# Patient Record
Sex: Male | Born: 1955
Health system: Southern US, Community
[De-identification: ages and names within clinical notes are randomized; demographics above are authoritative.]

## PROBLEM LIST (undated history)

## (undated) DIAGNOSIS — I10 Essential (primary) hypertension: Secondary | ICD-10-CM

## (undated) DIAGNOSIS — B159 Hepatitis A without hepatic coma: Secondary | ICD-10-CM

## (undated) DIAGNOSIS — C801 Malignant (primary) neoplasm, unspecified: Secondary | ICD-10-CM

## (undated) DIAGNOSIS — C61 Malignant neoplasm of prostate: Secondary | ICD-10-CM

## (undated) DIAGNOSIS — K219 Gastro-esophageal reflux disease without esophagitis: Secondary | ICD-10-CM

## (undated) HISTORY — DX: Hepatitis a without hepatic coma: B15.9

## (undated) HISTORY — PX: TRANSURETHRAL RESECTION OF PROSTATE: SHX73

## (undated) HISTORY — DX: Essential (primary) hypertension: I10

---

## 2001-02-13 ENCOUNTER — Encounter: Payer: Self-pay | Admitting: Family Medicine

## 2001-02-13 ENCOUNTER — Ambulatory Visit (HOSPITAL_COMMUNITY): Admission: RE | Admit: 2001-02-13 | Discharge: 2001-02-13 | Payer: Self-pay | Admitting: *Deleted

## 2004-03-31 ENCOUNTER — Emergency Department (HOSPITAL_COMMUNITY): Admission: EM | Admit: 2004-03-31 | Discharge: 2004-04-01 | Payer: Self-pay | Admitting: Emergency Medicine

## 2007-07-23 ENCOUNTER — Emergency Department (HOSPITAL_COMMUNITY): Admission: EM | Admit: 2007-07-23 | Discharge: 2007-07-23 | Payer: Self-pay | Admitting: Emergency Medicine

## 2008-06-29 ENCOUNTER — Ambulatory Visit: Admission: RE | Admit: 2008-06-29 | Discharge: 2008-07-10 | Payer: Self-pay | Admitting: Radiation Oncology

## 2008-09-18 ENCOUNTER — Encounter (INDEPENDENT_AMBULATORY_CARE_PROVIDER_SITE_OTHER): Payer: Self-pay | Admitting: Urology

## 2008-09-18 ENCOUNTER — Inpatient Hospital Stay (HOSPITAL_COMMUNITY): Admission: RE | Admit: 2008-09-18 | Discharge: 2008-09-19 | Payer: Self-pay | Admitting: Urology

## 2011-03-04 NOTE — Op Note (Signed)
Russell Jackson, Russell Jackson                  ACCOUNT NO.:  [id number]   MEDICAL RECORD NO.:  [id number]          PATIENT TYPE:  INP   LOCATION:  0008                         FACILITY:  [facility]   PHYSICIAN:  Heloise Purpura, MD      DATE OF BIRTH:  [birth year]   DATE OF PROCEDURE:  09/18/2008  DATE OF DISCHARGE:                               OPERATIVE REPORT   PREOPERATIVE DIAGNOSIS:  Clinically localized adenocarcinoma of prostate  (clinical stage T1C, NX, MX).   POSTOPERATIVE DIAGNOSIS:  Clinically localized adenocarcinoma of  prostate (clinical stage T1C, NX, MX).   PROCEDURE:  1. Robotic assisted laparoscopic radical prostatectomy (bilateral      nerve sparing).  2. Bilateral laparoscopic pelvic lymphadenectomy.   SURGEON:  Heloise Purpura, MD   ASSISTANT:  Delia Chimes, nurse practitioner.   ANESTHESIA:  General.   COMPLICATIONS:  None.   ESTIMATED BLOOD LOSS:  100 mL.   INTRAVENOUS FLUIDS:  1800 mL of lactated Ringer's.   SPECIMENS:  1. Prostate and seminal vesicles.  2. Right pelvic lymph nodes.  3. Left pelvic lymph nodes.   DISPOSITION OF SPECIMEN:  To pathology.   DRAINS:  1. 20-French coude catheter.  2. #19 Blake pelvic drain.   INDICATIONS:  Mr. Sciandra is a 56 year old gentleman with clinically  localized adenocarcinoma of prostate.  After discussion regarding  management options for treatment, he elected to proceed with surgical  therapy and the above procedures.  Potential risks, complications, and  alternative options were discussed in detail with the patient and  informed consent was obtained.   DESCRIPTION OF PROCEDURE:  The patient was taken to the operating room  and a general anesthetic was administered.  He was given preoperative  antibiotics, placed in the dorsal lithotomy position, and prepped and  draped in the usual sterile fashion.  Next a preoperative time-out was  performed.  A Foley catheter was then inserted into the bladder and a  site was selected  just superior to the umbilicus for placement of camera  port.  This was placed using a standard open Hasson technique which  allowed entry into the peritoneal cavity under direct vision and without  difficulty.  A 12 mm port was then placed and a pneumoperitoneum  established.  The 0 degrees lens was used to inspect the abdomen and  there was no evidence for any intra-abdominal injuries or other  abnormalities.  The remaining ports were then placed.  Bilateral 8 mm  robotic ports placed 10 cm lateral and just inferior to the camera port  site.  An additional 8 mm robotic was placed in the far left abdominal  wall.  A 5 mm port was placed between the camera port and the right  robotic port and an additional 12 mm port was placed in the far right  lateral abdominal wall for laparoscopic assistance.  All ports were  placed under direct vision and without difficulty.  The surgical cart  was then docked.  With the aid of the cautery scissors, the bladder was  reflected posteriorly allowing entry into space of Retzius and  identification of the endopelvic fascia and prostate.  The endopelvic  fascia was then incised from the apex back to the base of the prostate  bilaterally and the underlying levator muscle fibers were swept  laterally off the prostate.  This isolated the dorsal venous complex  which was then stapled and divided with the 60 mm articulating Ethicon  stapler.  The bladder neck was then identified with the aid of Foley  catheter manipulation and was divided anteriorly.  This allowed exposure  of catheter.  The catheter balloon was deflated and the catheter was  brought into the operative field and used to retract the prostate  anteriorly.  This exposed the posterior bladder neck.  There was noted  to be a small median lobe which was excised with the prostate.  Dissection then proceeded between the median lobe and bladder until the  vasa deferentia and seminal vesicles were  identified.  The vasa  deferentia were isolated, divided and lifted anteriorly.  The seminal  vessels then dissected down to their tips with care to control seminal  vesicle arterial blood supply.  The space between Denonvilliers fascia  and the anterior rectum was then bluntly developed thereby isolating the  vascular pedicles of the prostate.  The lateral prostatic fascia was  incised sharply allowing the neurovascular bundles to be released  bilaterally and the vascular pedicles of the prostate was then ligated  with Hem-o-lok clips above the left neurovascular bundles.  The  neurovascular bundles were then swept off the apex of prostate and the  urethra and urethra was sharply transected allowing the prostate  specimen to be disarticulated.  The pelvis was then copiously irrigated.  Hemostasis was ensured.  There was no evidence for a rectal injury.  Attention then turned to the right pelvic sidewall.  The fibrofatty  tissue between the external iliac vein, confluence of the iliac vessels,  hypogastric artery, and Cooper's ligament was dissected free from the  pelvic sidewall with care to preserve the obturator nerve.  Hem-o-lok  clips were used for lymphostasis and hemostasis.  Specimen was then  passed off for permanent pathologic analysis and an identical procedure  was performed on the contralateral side.  Attention then turned to the  urethral anastomosis.  A 2-0 Vicryl slip-knot was placed at 6 o'clock  position between the bladder neck, urethra, and Denonvilliers fascia to  reapproximate these structures.  A double-armed 3-0 Monocryl suture was  then used to perform a 360 degree running tension-free anastomosis.  A  new 20-French coude catheter was inserted into the bladder and  irrigated.  There no blood clots within the bladder and the anastomosis  appeared be watertight.  A #19 Blake drain was then brought through the  left robotic port and appropriately positioned in the  pelvis.  It was  secured to the skin with a nylon suture.  The surgical cart was then  undocked.  The right lateral 12 mm port site was closed with a 0 Vicryl  suture placed of the aid of suture passer device.  The remaining ports  were then removed under direct vision and prostate specimen was removed  intact within the Endopouch retrieval bag via the periumbilical port  site.  This fascial opening was then closed with a running 0 Vicryl  suture.  All port sites were injected with 0.25% Marcaine and  reapproximated at skin level with staples.  Sterile dressings were  applied.  The patient appeared to tolerate the procedure well and  without complications.  He was able to be extubated and transferred to  recovery unit in satisfactory condition.      Heloise Purpura, MD  Electronically Signed     LB/MEDQ  D:  09/18/2008  T:  09/18/2008  Job:  [id number]

## 2011-07-22 LAB — BASIC METABOLIC PANEL
BUN: 13 mg/dL (ref 6–23)
CO2: 29 mEq/L (ref 19–32)
Calcium: 9.2 mg/dL (ref 8.4–10.5)
Chloride: 107 mEq/L (ref 96–112)
Creatinine, Ser: 1.19 mg/dL (ref 0.4–1.5)
GFR calc Af Amer: >60 mL/min (ref >60)
GFR calc non Af Amer: >60 mL/min (ref >60)
Glucose, Bld: 97 mg/dL (ref 70–99)
Potassium: 4.2 mEq/L (ref 3.5–5.1)
Sodium: 142 mEq/L (ref 135–145)

## 2011-07-22 LAB — HEMOGLOBIN AND HEMATOCRIT, BLOOD
HCT: 39.1 % (ref 39.0–52.0)
Hemoglobin: 13.1 g/dL (ref 13.0–17.0)

## 2011-07-22 LAB — CBC
HCT: 45.3 % (ref 39.0–52.0)
Hemoglobin: 14.9 g/dL (ref 13.0–17.0)
MCHC: 32.9 g/dL (ref 30.0–36.0)
MCV: 92.8 fL (ref 78.0–100.0)
Platelets: 192 10*3/uL (ref 150–400)
RBC: 4.89 MIL/uL (ref 4.22–5.81)
RDW: 13.3 % (ref 11.5–15.5)
WBC: 5.9 10*3/uL (ref 4.0–10.5)

## 2011-07-22 LAB — TYPE AND SCREEN
ABO/RH(D): A POS
Antibody Screen: NEGATIVE

## 2011-07-25 LAB — HEMOGLOBIN AND HEMATOCRIT, BLOOD: Hemoglobin: 12.6 g/dL — ABNORMAL LOW (ref 13.0–17.0)

## 2011-07-31 LAB — URINE CULTURE

## 2016-01-12 ENCOUNTER — Ambulatory Visit (INDEPENDENT_AMBULATORY_CARE_PROVIDER_SITE_OTHER): Payer: PRIVATE HEALTH INSURANCE | Admitting: Family Medicine

## 2016-01-12 VITALS — BP 122/80 | HR 80 | Temp 98.1°F | Resp 16 | Ht 67.25 in | Wt 177.6 lb

## 2016-01-12 DIAGNOSIS — H8111 Benign paroxysmal vertigo, right ear: Secondary | ICD-10-CM

## 2016-01-12 DIAGNOSIS — J0100 Acute maxillary sinusitis, unspecified: Secondary | ICD-10-CM | POA: Diagnosis not present

## 2016-01-12 MED ORDER — MECLIZINE HCL 12.5 MG PO TABS: 12.5000 mg | ORAL_TABLET | Freq: Three times a day (TID) | ORAL | Status: DC | PRN

## 2016-01-12 MED ORDER — AMOXICILLIN 500 MG PO CAPS: 500.0000 mg | ORAL_CAPSULE | Freq: Three times a day (TID) | ORAL | Status: DC

## 2016-01-12 NOTE — Patient Instructions (Signed)
Benign Positional Vertigo Vertigo is the feeling that you or your surroundings are moving when they are not. Benign positional vertigo is the most common form of vertigo. The cause of this condition is not serious (is benign). This condition is triggered by certain movements and positions (is positional). This condition can be dangerous if it occurs while you are doing something that could endanger you or others, such as driving.  CAUSES In many cases, the cause of this condition is not known. It may be caused by a disturbance in an area of the inner ear that helps your brain to sense movement and balance. This disturbance can be caused by a viral infection (labyrinthitis), head injury, or repetitive motion. RISK FACTORS This condition is more likely to develop in:  Women.  People who are 50 years of age or older. SYMPTOMS Symptoms of this condition usually happen when you move your head or your eyes in different directions. Symptoms may start suddenly, and they usually last for less than a minute. Symptoms may include:  Loss of balance and falling.  Feeling like you are spinning or moving.  Feeling like your surroundings are spinning or moving.  Nausea and vomiting.  Blurred vision.  Dizziness.  Involuntary eye movement (nystagmus). Symptoms can be mild and cause only slight annoyance, or they can be severe and interfere with daily life. Episodes of benign positional vertigo may return (recur) over time, and they may be triggered by certain movements. Symptoms may improve over time. DIAGNOSIS This condition is usually diagnosed by medical history and a physical exam of the head, neck, and ears. You may be referred to a health care provider who specializes in ear, nose, and throat (ENT) problems (otolaryngologist) or a provider who specializes in disorders of the nervous system (neurologist). You may have additional testing, including:  MRI.  A CT scan.  Eye movement tests. Your  health care provider may ask you to change positions quickly while he or she watches you for symptoms of benign positional vertigo, such as nystagmus. Eye movement may be tested with an electronystagmogram (ENG), caloric stimulation, the Dix-Hallpike test, or the roll test.  An electroencephalogram (EEG). This records electrical activity in your brain.  Hearing tests. TREATMENT Usually, your health care provider will treat this by moving your head in specific positions to adjust your inner ear back to normal. Surgery may be needed in severe cases, but this is rare. In some cases, benign positional vertigo may resolve on its own in 2-4 weeks. HOME CARE INSTRUCTIONS Safety  Move slowly.Avoid sudden body or head movements.  Avoid driving.  Avoid operating heavy machinery.  Avoid doing any tasks that would be dangerous to you or others if a vertigo episode would occur.  If you have trouble walking or keeping your balance, try using a cane for stability. If you feel dizzy or unstable, sit down right away.  Return to your normal activities as told by your health care provider. Ask your health care provider what activities are safe for you. General Instructions  Take over-the-counter and prescription medicines only as told by your health care provider.  Avoid certain positions or movements as told by your health care provider.  Drink enough fluid to keep your urine clear or pale yellow.  Keep all follow-up visits as told by your health care provider. This is important. SEEK MEDICAL CARE IF:  You have a fever.  Your condition gets worse or you develop new symptoms.  Your family or friends   notice any behavioral changes.  Your nausea or vomiting gets worse.  You have numbness or a "pins and needles" sensation. SEEK IMMEDIATE MEDICAL CARE IF:  You have difficulty speaking or moving.  You are always dizzy.  You faint.  You develop severe headaches.  You have weakness in your  legs or arms.  You have changes in your hearing or vision.  You develop a stiff neck.  You develop sensitivity to light.   This information is not intended to replace advice given to you by your health care provider. Make sure you discuss any questions you have with your health care provider.   Document Released: 07/14/2006 Document Revised: 06/27/2015 Document Reviewed: 01/29/2015 Elsevier Interactive Patient Education 2016 Else[employer] Sinusitis, Adult Sinusitis is redness, soreness, and inflammation of the paranasal sinuses. Paranasal sinuses are air pockets within the bones of your face. They are located beneath your eyes, in the middle of your forehead, and above your eyes. In healthy paranasal sinuses, mucus is able to drain out, and air is able to circulate through them by way of your nose. However, when your paranasal sinuses are inflamed, mucus and air can become trapped. This can allow bacteria and other germs to grow and cause infection. Sinusitis can develop quickly and last only a short time (acute) or continue over a long period (chronic). Sinusitis that lasts for more than 12 weeks is considered chronic. CAUSES Causes of sinusitis include:  Allergies.  Structural abnormalities, such as displacement of the cartilage that separates your nostrils (deviated septum), which can decrease the air flow through your nose and sinuses and affect sinus drainage.  Functional abnormalities, such as when the small hairs (cilia) that line your sinuses and help remove mucus do not work properly or are not present. SIGNS AND SYMPTOMS Symptoms of acute and chronic sinusitis are the same. The primary symptoms are pain and pressure around the affected sinuses. Other symptoms include:  Upper toothache.  Earache.  Headache.  Bad breath.  Decreased sense of smell and taste.  A cough, which worsens when you are lying flat.  Fatigue.  Fever.  Thick drainage from your nose, which often  is green and may contain pus (purulent).  Swelling and warmth over the affected sinuses. DIAGNOSIS Your health care provider will perform a physical exam. During your exam, your health care provider may perform any of the following to help determine if you have acute sinusitis or chronic sinusitis:  Look in your nose for signs of abnormal growths in your nostrils (nasal polyps).  Tap over the affected sinus to check for signs of infection.  View the inside of your sinuses using an imaging device that has a light attached (endoscope). If your health care provider suspects that you have chronic sinusitis, one or more of the following tests may be recommended:  Allergy tests.  Nasal culture. A sample of mucus is taken from your nose, sent to a lab, and screened for bacteria.  Nasal cytology. A sample of mucus is taken from your nose and examined by your health care provider to determine if your sinusitis is related to an allergy. TREATMENT Most cases of acute sinusitis are related to a viral infection and will resolve on their own within 10 days. Sometimes, medicines are prescribed to help relieve symptoms of both acute and chronic sinusitis. These may include pain medicines, decongestants, nasal steroid sprays, or saline sprays. However, for sinusitis related to a bacterial infection, your health care provider will prescribe antibiotic medicines.  These are medicines that will help kill the bacteria causing the infection. Rarely, sinusitis is caused by a fungal infection. In these cases, your health care provider will prescribe antifungal medicine. For some cases of chronic sinusitis, surgery is needed. Generally, these are cases in which sinusitis recurs more than 3 times per year, despite other treatments. HOME CARE INSTRUCTIONS  Drink plenty of water. Water helps thin the mucus so your sinuses can drain more easily.  Use a humidifier.  Inhale steam 3-4 times a day (for example, sit in  the bathroom with the shower running).  Apply a warm, moist washcloth to your face 3-4 times a day, or as directed by your health care provider.  Use saline nasal sprays to help moisten and clean your sinuses.  Take medicines only as directed by your health care provider.  If you were prescribed either an antibiotic or antifungal medicine, finish it all even if you start to feel better. SEEK IMMEDIATE MEDICAL CARE IF:  You have increasing pain or severe headaches.  You have nausea, vomiting, or drowsiness.  You have swelling around your face.  You have vision problems.  You have a stiff neck.  You have difficulty breathing.   This information is not intended to replace advice given to you by your health care provider. Make sure you discuss any questions you have with your health care provider.   Document Released: 10/06/2005 Document Revised: 10/27/2014 Document Reviewed: 10/21/2011 Elsevier Interactive Patient Education [employer].

## 2016-01-12 NOTE — Progress Notes (Signed)
   Subjective:    Patient ID: Russell Jackson, male    DOB: 07-Aug-1956, 60 y.o.   MRN: [id number] By signing my name below, I, Russell Jackson, attest that this documentation has been prepared under the direction and in the presence of Russell Haber, MD. Electronically Signed: Soijett Jackson, ED Scribe. 01/12/2016. 1:49 PM.  Chief Complaint  Patient presents with  . Dizziness  . Sinus Problem  . Cough    HPI  HPI Comments: Russell Jackson is a 60 y.o. male who presents today complaining of dizziness onset this morning. He reports that his symptoms began with URI-like symptoms. Pt denies his dizziness causing him to throw up. Pt notes that he had to hold onto things to brace himself while walking and htat his dizziness is worsened with turning his head left and right. Pt last episode of dizziness was 15 years ago. He states that he is having associated symptoms of bilateral tinnitus, cough, and sneezing. He states that he has not tried any medications for the relief for his symptoms. He denies hearing loss, ear pain, and any other symptoms.  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   History reviewed. No pertinent past medical history.  No Known Allergies  No current outpatient prescriptions on file prior to visit.   No current facility-administered medications on file prior to visit.     Review of Systems  HENT: Positive for tinnitus. Negative for ear pain.   Respiratory: Positive for cough.   Neurological: Positive for dizziness.  All other systems reviewed and are negative.      Objective:   Physical Exam  Constitutional: He is oriented to person, place, and time. He appears well-developed and well-nourished. No distress.  Had the pt to lie down abrupt quickly and it reproduces vertigo symptoms for approximately 25 seconds.   HENT:  Head: Normocephalic and atraumatic.  Right Ear: Tympanic membrane, external ear and ear canal normal.  Left Ear:  Tympanic membrane, external ear and ear canal normal.  Narrow nasal passages with nuchal purulent discharge.  Eyes: EOM are normal. Pupils are equal, round, and reactive to light. Right eye exhibits no nystagmus. Left eye exhibits no nystagmus.  Neck: Neck supple.  Cardiovascular: Normal rate.   Pulmonary/Chest: Effort normal. No respiratory distress.  Musculoskeletal: Normal range of motion.  Neurological: He is alert and oriented to person, place, and time.  Skin: Skin is warm and dry.  Psychiatric: He has a normal mood and affect. His behavior is normal.  Nursing note and vitals reviewed.        BP 122/80 mmHg  Pulse 80  Temp(Src) 98.1 F (36.7 C) (Oral)  Resp 16  Ht 5' 7.25" (1.708 m)  Wt 177 lb 9.6 oz (80.559 kg)  BMI 27.61 kg/m2  SpO2 98%  Assessment & Plan:   Benign paroxysmal positional vertigo, right - Plan: meclizine (ANTIVERT) 12.5 MG tablet, amoxicillin (AMOXIL) 500 MG capsule  Acute maxillary sinusitis, recurrence not specified - Plan: meclizine (ANTIVERT) 12.5 MG tablet, amoxicillin (AMOXIL) 500 MG capsule  This chart was scribed in my presence and reviewed by me personally. Russell Shadow Russell Eshleman,MD

## 2016-06-26 ENCOUNTER — Other Ambulatory Visit: Payer: Self-pay | Admitting: Gastroenterology

## 2016-07-17 ENCOUNTER — Encounter (HOSPITAL_COMMUNITY): Payer: Self-pay | Admitting: *Deleted

## 2016-07-21 NOTE — Anesthesia Preprocedure Evaluation (Addendum)
Anesthesia Evaluation  Patient identified by MRN, date of birth, ID band Patient awake    Reviewed: Allergy & Precautions, NPO status , Patient's Chart, lab work & pertinent test results  History of Anesthesia Complications Negative for: history of anesthetic complications  Airway Mallampati: I  TM Distance: >3 FB Neck ROM: Full    Dental  (+) Dental Advisory Given, Upper Dentures   Pulmonary neg shortness of breath, neg sleep apnea, neg COPD, neg recent URI, former smoker,    Pulmonary exam normal breath sounds clear to auscultation       Cardiovascular negative cardio ROS   Rhythm:Regular Rate:Normal     Neuro/Psych neg Seizures Right BPPV    GI/Hepatic Neg liver ROS, Bowel prep,GERD  Controlled,  Endo/Other  negative endocrine ROS  Renal/GU negative Renal ROS     Musculoskeletal   Abdominal   Peds  Hematology negative hematology ROS (+)   Anesthesia Other Findings H/o prostate cancer  Reproductive/Obstetrics                           Anesthesia Physical Anesthesia Plan  ASA: II  Anesthesia Plan: MAC   Post-op Pain Management:    Induction: Intravenous  Airway Management Planned: Natural Airway and Nasal Cannula  Additional Equipment:   Intra-op Plan:   Post-operative Plan:   Informed Consent: I have reviewed the patients History and Physical, chart, labs and discussed the procedure including the risks, benefits and alternatives for the proposed anesthesia with the patient or authorized representative who has indicated his/her understanding and acceptance.   Dental advisory given  Plan Discussed with: CRNA  Anesthesia Plan Comments:        Anesthesia Quick Evaluation

## 2016-07-22 ENCOUNTER — Encounter (HOSPITAL_COMMUNITY): Payer: Self-pay | Admitting: *Deleted

## 2016-07-22 ENCOUNTER — Encounter (HOSPITAL_COMMUNITY)
Admission: RE | Disposition: A | Discharge status: Home / Self Care | Payer: Self-pay | Source: Ambulatory Visit | Attending: Gastroenterology

## 2016-07-22 ENCOUNTER — Ambulatory Visit (HOSPITAL_COMMUNITY): Payer: PRIVATE HEALTH INSURANCE | Admitting: Anesthesiology

## 2016-07-22 ENCOUNTER — Ambulatory Visit (HOSPITAL_COMMUNITY)
Admission: RE | Admit: 2016-07-22 | Discharge: 2016-07-22 | Disposition: A | Discharge status: Home / Self Care | Payer: PRIVATE HEALTH INSURANCE | Source: Ambulatory Visit | Attending: Gastroenterology | Admitting: Gastroenterology

## 2016-07-22 DIAGNOSIS — Z87442 Personal history of urinary calculi: Secondary | ICD-10-CM | POA: Diagnosis not present

## 2016-07-22 DIAGNOSIS — K219 Gastro-esophageal reflux disease without esophagitis: Secondary | ICD-10-CM | POA: Insufficient documentation

## 2016-07-22 DIAGNOSIS — D125 Benign neoplasm of sigmoid colon: Secondary | ICD-10-CM | POA: Insufficient documentation

## 2016-07-22 DIAGNOSIS — Z86010 Personal history of colonic polyps: Secondary | ICD-10-CM | POA: Diagnosis not present

## 2016-07-22 DIAGNOSIS — Z8546 Personal history of malignant neoplasm of prostate: Secondary | ICD-10-CM | POA: Diagnosis not present

## 2016-07-22 DIAGNOSIS — Z1211 Encounter for screening for malignant neoplasm of colon: Secondary | ICD-10-CM | POA: Diagnosis present

## 2016-07-22 DIAGNOSIS — Z87891 Personal history of nicotine dependence: Secondary | ICD-10-CM | POA: Insufficient documentation

## 2016-07-22 HISTORY — DX: Gastro-esophageal reflux disease without esophagitis: K21.9

## 2016-07-22 HISTORY — DX: Malignant (primary) neoplasm, unspecified: C80.1

## 2016-07-22 HISTORY — PX: COLONOSCOPY WITH PROPOFOL: SHX5780

## 2016-07-22 SURGERY — COLONOSCOPY WITH PROPOFOL
Anesthesia: Monitor Anesthesia Care

## 2016-07-22 MED ORDER — LACTATED RINGERS IV SOLN
INTRAVENOUS | Status: DC | PRN
  Administered 2016-07-22: 07:00:00 via INTRAVENOUS

## 2016-07-22 MED ORDER — PROPOFOL 10 MG/ML IV BOLUS
INTRAVENOUS | Status: AC
  Filled 2016-07-22: qty 40

## 2016-07-22 MED ORDER — PROMETHAZINE HCL 25 MG/ML IJ SOLN: 6.2500 mg | INTRAMUSCULAR | Status: DC | PRN

## 2016-07-22 MED ORDER — SODIUM CHLORIDE 0.9 % IV SOLN: INTRAVENOUS | Status: DC

## 2016-07-22 MED ORDER — PROPOFOL 10 MG/ML IV BOLUS
INTRAVENOUS | Status: AC
  Filled 2016-07-22: qty 20

## 2016-07-22 MED ORDER — PROPOFOL 500 MG/50ML IV EMUL
INTRAVENOUS | Status: DC | PRN
  Administered 2016-07-22: 200 ug/kg/min via INTRAVENOUS

## 2016-07-22 MED ORDER — FENTANYL CITRATE (PF) 100 MCG/2ML IJ SOLN: 25.0000 ug | INTRAMUSCULAR | Status: DC | PRN

## 2016-07-22 MED ORDER — LACTATED RINGERS IV SOLN
INTRAVENOUS | Status: DC
  Administered 2016-07-22: 07:00:00 via INTRAVENOUS

## 2016-07-22 SURGICAL SUPPLY — 22 items

## 2016-07-22 NOTE — Op Note (Signed)
[facility] Patient Name: Russell Jackson Procedure Date: 07/22/2016 MRN: [id number] Attending MD: Garlan Fair , MD Date of Birth: [birth year] CSN: [id number] Age: 60 Admit Type: Outpatient Procedure:                Colonoscopy Indications:              High risk colon cancer surveillance: Personal                            history of non-advanced adenoma Providers:                Garlan Fair, MD, Hilma Favors, RN, William Dalton, Technician Referring MD:              Medicines:                Propofol per Anesthesia Complications:            No immediate complications. Estimated Blood Loss:     Estimated blood loss: none. Procedure:                Pre-Anesthesia Assessment:                           - Prior to the procedure, a History and Physical                            was performed, and patient medications and                            allergies were reviewed. The patient's tolerance of                            previous anesthesia was also reviewed. The risks                            and benefits of the procedure and the sedation                            options and risks were discussed with the patient.                            All questions were answered, and informed consent                            was obtained. Prior Anticoagulants: The patient has                            taken no previous anticoagulant or antiplatelet                            agents. ASA Grade Assessment: II - A patient with  mild systemic disease. After reviewing the risks                            and benefits, the patient was deemed in                            satisfactory condition to undergo the procedure.                           After obtaining informed consent, the colonoscope                            was passed under direct vision. Throughout the                            procedure, the patient's  blood pressure, pulse, and                            oxygen saturations were monitored continuously. The                            [id number] scope was introduced through                            the anus and advanced to the the cecum, identified                            by appendiceal orifice and ileocecal valve. The                            colonoscopy was somewhat difficult due to                            significant looping. The patient tolerated the                            procedure well. The quality of the bowel                            preparation was good. The appendiceal orifice and                            the rectum were photographed. Scope In: 7:31:50 AM Scope Out: 8:01:25 AM Total Procedure Duration: 0 hours 29 minutes 35 seconds  Findings:      The perianal and digital rectal examinations were normal.      A 3 mm polyp was found in the mid sigmoid colon. The polyp was sessile.       The polyp was removed with a cold biopsy forceps. Resection and       retrieval were complete.      The exam was otherwise without abnormality. Impression:               - One 3 mm polyp in the mid sigmoid colon, removed  with a cold biopsy forceps. Resected and retrieved.                           - The examination was otherwise normal. Moderate Sedation:      N/A- Per Anesthesia Care Recommendation:           - Patient has a contact number available for                            emergencies. The signs and symptoms of potential                            delayed complications were discussed with the                            patient. Return to normal activities tomorrow.                            Written discharge instructions were provided to the                            patient.                           - Repeat colonoscopy in 5 years for surveillance.                           - Resume previous diet.                           -  Continue present medications. Procedure Code(s):        --- Professional ---                           [phone], Colonoscopy, flexible; with biopsy, single                            or multiple Diagnosis Code(s):        --- Professional ---                           Z86.010, Personal history of colonic polyps                           D12.5, Benign neoplasm of sigmoid colon CPT copyright 2016 American Medical Association. All rights reserved. The codes documented in this report are preliminary and upon coder review may  be revised to meet current compliance requirements. Earle Gell, MD Garlan Fair, MD 07/22/2016 8:08:44 AM This report has been signed electronically. Number of Addenda: 0

## 2016-07-22 NOTE — Anesthesia Postprocedure Evaluation (Signed)
Anesthesia Post Note  Patient: Russell Jackson  Procedure(s) Performed: Procedure(s) (LRB): COLONOSCOPY WITH PROPOFOL (N/A)  Patient location during evaluation: PACU Anesthesia Type: MAC Level of consciousness: awake and alert Pain management: pain level controlled Vital Signs Assessment: post-procedure vital signs reviewed and stable Respiratory status: spontaneous breathing, nonlabored ventilation and respiratory function stable Cardiovascular status: stable and blood pressure returned to baseline Anesthetic complications: no    Last Vitals:  Vitals:   07/22/16 0650 07/22/16 0810  BP: (!) 134/98 113/71  Pulse: 93 100  Resp: 12 20  Temp: 36.4 C 36.5 C    Last Pain:  Vitals:   07/22/16 0810  TempSrc: Oral                 Nilda Simmer

## 2016-07-22 NOTE — H&P (Signed)
Procedure: Surveillance colonoscopy. August 2007 colonoscopy was performed with removal of a diminutive ascending colon tubular adenomatous polyp. August 2012 normal surveillance colonoscopy was performed.  History: The patient is a 60 year old male born 13-Jun-1956. He is scheduled to undergo a surveillance colonoscopy today.  Past medical history: Prostatectomy for prostate cancer. Kidney stone.  Medication allergies: Naprosyn  Exam: The patient is alert and lying comfortably on the endoscopy stretcher. Abdomen is soft and nontender to palpation. Lungs are clear to auscultation. Cardiac exam reveals a regular rhythm.  Plan: Proceed with surveillance colonoscopy

## 2016-07-22 NOTE — Discharge Instructions (Signed)

## 2016-07-22 NOTE — Transfer of Care (Signed)
Immediate Anesthesia Transfer of Care Note  Patient: Russell Jackson  Procedure(s) Performed: Procedure(s): COLONOSCOPY WITH PROPOFOL (N/A)  Patient Location: PACU  Anesthesia Type:MAC  Level of Consciousness: sedated, patient cooperative and responds to stimulation  Airway & Oxygen Therapy: Patient Spontanous Breathing and Patient connected to face mask oxygen  Post-op Assessment: Report given to RN, Post -op Vital signs reviewed and stable and Patient moving all extremities X 4  Post vital signs: stable  Last Vitals:  Vitals:   07/22/16 0650  BP: (!) 134/98  Pulse: 93  Resp: 12  Temp: 36.4 C    Last Pain:  Vitals:   07/22/16 0650  TempSrc: Oral         Complications: No apparent anesthesia complications

## 2016-07-23 ENCOUNTER — Encounter (HOSPITAL_COMMUNITY): Payer: Self-pay | Admitting: Gastroenterology

## 2017-04-21 DIAGNOSIS — C61 Malignant neoplasm of prostate: Secondary | ICD-10-CM | POA: Diagnosis not present

## 2017-04-28 DIAGNOSIS — N5201 Erectile dysfunction due to arterial insufficiency: Secondary | ICD-10-CM | POA: Diagnosis not present

## 2017-04-28 DIAGNOSIS — C61 Malignant neoplasm of prostate: Secondary | ICD-10-CM | POA: Diagnosis not present

## 2017-04-28 DIAGNOSIS — N393 Stress incontinence (female) (male): Secondary | ICD-10-CM | POA: Diagnosis not present

## 2017-05-29 DIAGNOSIS — Z8546 Personal history of malignant neoplasm of prostate: Secondary | ICD-10-CM | POA: Diagnosis not present

## 2017-05-29 DIAGNOSIS — Z0000 Encounter for general adult medical examination without abnormal findings: Secondary | ICD-10-CM | POA: Diagnosis not present

## 2017-05-29 DIAGNOSIS — K219 Gastro-esophageal reflux disease without esophagitis: Secondary | ICD-10-CM | POA: Diagnosis not present

## 2017-05-29 DIAGNOSIS — E7800 Pure hypercholesterolemia, unspecified: Secondary | ICD-10-CM | POA: Diagnosis not present

## 2017-05-29 DIAGNOSIS — M519 Unspecified thoracic, thoracolumbar and lumbosacral intervertebral disc disorder: Secondary | ICD-10-CM | POA: Diagnosis not present

## 2017-05-29 DIAGNOSIS — N200 Calculus of kidney: Secondary | ICD-10-CM | POA: Diagnosis not present

## 2017-05-29 DIAGNOSIS — F411 Generalized anxiety disorder: Secondary | ICD-10-CM | POA: Diagnosis not present

## 2017-05-29 DIAGNOSIS — Z1389 Encounter for screening for other disorder: Secondary | ICD-10-CM | POA: Diagnosis not present

## 2017-06-25 DIAGNOSIS — Z0100 Encounter for examination of eyes and vision without abnormal findings: Secondary | ICD-10-CM | POA: Diagnosis not present

## 2018-01-04 DIAGNOSIS — M5416 Radiculopathy, lumbar region: Secondary | ICD-10-CM | POA: Diagnosis not present

## 2018-01-18 DIAGNOSIS — M5416 Radiculopathy, lumbar region: Secondary | ICD-10-CM | POA: Diagnosis not present

## 2018-04-04 ENCOUNTER — Emergency Department (HOSPITAL_COMMUNITY): Payer: 59

## 2018-04-04 ENCOUNTER — Emergency Department (HOSPITAL_COMMUNITY)
Admission: EM | Admit: 2018-04-04 | Discharge: 2018-04-04 | Disposition: A | Discharge status: Home / Self Care | Payer: 59 | Attending: Emergency Medicine | Admitting: Emergency Medicine

## 2018-04-04 ENCOUNTER — Encounter (HOSPITAL_COMMUNITY): Payer: Self-pay | Admitting: Emergency Medicine

## 2018-04-04 DIAGNOSIS — Y999 Unspecified external cause status: Secondary | ICD-10-CM | POA: Diagnosis not present

## 2018-04-04 DIAGNOSIS — S8002XA Contusion of left knee, initial encounter: Secondary | ICD-10-CM | POA: Insufficient documentation

## 2018-04-04 DIAGNOSIS — S301XXA Contusion of abdominal wall, initial encounter: Secondary | ICD-10-CM | POA: Insufficient documentation

## 2018-04-04 DIAGNOSIS — S299XXA Unspecified injury of thorax, initial encounter: Secondary | ICD-10-CM | POA: Diagnosis not present

## 2018-04-04 DIAGNOSIS — R51 Headache: Secondary | ICD-10-CM | POA: Diagnosis not present

## 2018-04-04 DIAGNOSIS — S9002XA Contusion of left ankle, initial encounter: Secondary | ICD-10-CM | POA: Insufficient documentation

## 2018-04-04 DIAGNOSIS — S81012A Laceration without foreign body, left knee, initial encounter: Secondary | ICD-10-CM | POA: Diagnosis not present

## 2018-04-04 DIAGNOSIS — S99912A Unspecified injury of left ankle, initial encounter: Secondary | ICD-10-CM | POA: Diagnosis present

## 2018-04-04 DIAGNOSIS — Z8546 Personal history of malignant neoplasm of prostate: Secondary | ICD-10-CM | POA: Diagnosis not present

## 2018-04-04 DIAGNOSIS — Y939 Activity, unspecified: Secondary | ICD-10-CM | POA: Diagnosis not present

## 2018-04-04 DIAGNOSIS — M542 Cervicalgia: Secondary | ICD-10-CM | POA: Insufficient documentation

## 2018-04-04 DIAGNOSIS — Y92410 Unspecified street and highway as the place of occurrence of the external cause: Secondary | ICD-10-CM | POA: Insufficient documentation

## 2018-04-04 DIAGNOSIS — Z87891 Personal history of nicotine dependence: Secondary | ICD-10-CM | POA: Insufficient documentation

## 2018-04-04 DIAGNOSIS — M545 Low back pain: Secondary | ICD-10-CM | POA: Insufficient documentation

## 2018-04-04 DIAGNOSIS — S21119A Laceration without foreign body of unspecified front wall of thorax without penetration into thoracic cavity, initial encounter: Secondary | ICD-10-CM | POA: Diagnosis not present

## 2018-04-04 DIAGNOSIS — S0990XA Unspecified injury of head, initial encounter: Secondary | ICD-10-CM | POA: Diagnosis not present

## 2018-04-04 DIAGNOSIS — S3991XA Unspecified injury of abdomen, initial encounter: Secondary | ICD-10-CM | POA: Diagnosis not present

## 2018-04-04 DIAGNOSIS — S199XXA Unspecified injury of neck, initial encounter: Secondary | ICD-10-CM | POA: Diagnosis not present

## 2018-04-04 DIAGNOSIS — S91012A Laceration without foreign body, left ankle, initial encounter: Secondary | ICD-10-CM | POA: Diagnosis not present

## 2018-04-04 DIAGNOSIS — R109 Unspecified abdominal pain: Secondary | ICD-10-CM | POA: Diagnosis not present

## 2018-04-04 LAB — TYPE AND SCREEN
ABO/RH(D): A POS
Antibody Screen: NEGATIVE

## 2018-04-04 LAB — COMPREHENSIVE METABOLIC PANEL
ALK PHOS: 64 U/L (ref 38–126)
ALT: 16 U/L — AB (ref 17–63)
AST: 24 U/L (ref 15–41)
Albumin: 3.4 g/dL — ABNORMAL LOW (ref 3.5–5.0)
Anion gap: 8 (ref 5–15)
BUN: 15 mg/dL (ref 6–20)
CALCIUM: 9.4 mg/dL (ref 8.9–10.3)
CHLORIDE: 107 mmol/L (ref 101–111)
CO2: 26 mmol/L (ref 22–32)
CREATININE: 1.32 mg/dL — AB (ref 0.61–1.24)
GFR calc Af Amer: >60 mL/min (ref >60)
GFR calc non Af Amer: 56 mL/min — ABNORMAL LOW (ref >60)
Glucose, Bld: 91 mg/dL (ref 65–99)
Potassium: 3.9 mmol/L (ref 3.5–5.1)
SODIUM: 141 mmol/L (ref 135–145)
Total Bilirubin: 0.8 mg/dL (ref 0.3–1.2)
Total Protein: 6.7 g/dL (ref 6.5–8.1)

## 2018-04-04 LAB — CBC WITH DIFFERENTIAL/PLATELET
Abs Immature Granulocytes: 0 10*3/uL (ref 0.0–0.1)
BASOS PCT: 0 %
Basophils Absolute: 0 10*3/uL (ref 0.0–0.1)
EOS ABS: 0.4 10*3/uL (ref 0.0–0.7)
EOS PCT: 6 %
HEMATOCRIT: 50.4 % (ref 39.0–52.0)
Hemoglobin: 16.1 g/dL (ref 13.0–17.0)
Immature Granulocytes: 0 %
LYMPHS ABS: 1.5 10*3/uL (ref 0.7–4.0)
Lymphocytes Relative: 22 %
MCH: 30.4 pg (ref 26.0–34.0)
MCHC: 31.9 g/dL (ref 30.0–36.0)
MCV: 95.1 fL (ref 78.0–100.0)
MONO ABS: 0.5 10*3/uL (ref 0.1–1.0)
MONOS PCT: 7 %
Neutro Abs: 4.5 10*3/uL (ref 1.7–7.7)
Neutrophils Relative %: 65 %
Platelets: 182 10*3/uL (ref 150–400)
RBC: 5.3 MIL/uL (ref 4.22–5.81)
RDW: 12.8 % (ref 11.5–15.5)
WBC: 6.9 10*3/uL (ref 4.0–10.5)

## 2018-04-04 LAB — LIPASE, BLOOD: Lipase: 38 U/L (ref 11–51)

## 2018-04-04 MED ORDER — ONDANSETRON HCL 4 MG/2ML IJ SOLN
4.0000 mg | Freq: Once | INTRAMUSCULAR | Status: AC
  Administered 2018-04-04: 4 mg via INTRAVENOUS
  Filled 2018-04-04: qty 2

## 2018-04-04 MED ORDER — IOHEXOL 300 MG/ML  SOLN
100.0000 mL | Freq: Once | INTRAMUSCULAR | Status: AC | PRN
  Administered 2018-04-04: 100 mL via INTRAVENOUS

## 2018-04-04 MED ORDER — MORPHINE SULFATE (PF) 4 MG/ML IV SOLN
4.0000 mg | Freq: Once | INTRAVENOUS | Status: AC
  Administered 2018-04-04: 4 mg via INTRAVENOUS
  Filled 2018-04-04: qty 1

## 2018-04-04 MED ORDER — KETOROLAC TROMETHAMINE 30 MG/ML IJ SOLN
30.0000 mg | Freq: Once | INTRAMUSCULAR | Status: AC
  Administered 2018-04-04: 30 mg via INTRAVENOUS
  Filled 2018-04-04: qty 1
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
## 2018-04-04 NOTE — ED Notes (Signed)
Patient verbalizes understanding of discharge instructions. Opportunity for questioning and answers were provided. Armband removed by staff, pt discharged from ED ambulatory with crutches and family.  
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
## 2018-04-04 NOTE — ED Notes (Signed)
Ortho paged for ankle brace

## 2018-04-04 NOTE — ED Notes (Signed)
Assisted with removing patient from backboard with Dr. Elita Quick, EMT, and [place], [place]

## 2018-04-04 NOTE — ED Notes (Signed)
ED Provider at bedside. 

## 2018-04-04 NOTE — Progress Notes (Signed)
Orthopedic Tech Progress Note Patient Details:  Russell Jackson [birth year] [id number]  Ortho Devices Type of Ortho Device: ASO, Crutches Ortho Device/Splint Interventions: Application   Post Interventions Patient Tolerated: Well Instructions Provided: Care of device   [name] 04/04/2018, 7:24 PM

## 2018-04-05 LAB — I-STAT CHEM 8, ED
BUN: 25 mg/dL — AB (ref 6–20)
CHLORIDE: 106 mmol/L (ref 101–111)
CREATININE: 1.1 mg/dL (ref 0.61–1.24)
Calcium, Ion: 1.11 mmol/L — ABNORMAL LOW (ref 1.15–1.40)
Glucose, Bld: 91 mg/dL (ref 65–99)
HCT: 47 % (ref 39.0–52.0)
Hemoglobin: 16 g/dL (ref 13.0–17.0)
POTASSIUM: 6.3 mmol/L — AB (ref 3.5–5.1)
Sodium: 139 mmol/L (ref 135–145)
TCO2: 27 mmol/L (ref 22–32)

## 2018-04-05 LAB — ABO/RH: ABO/RH(D): A POS

## 2018-04-21 DIAGNOSIS — C61 Malignant neoplasm of prostate: Secondary | ICD-10-CM | POA: Diagnosis not present

## 2018-04-28 DIAGNOSIS — N5201 Erectile dysfunction due to arterial insufficiency: Secondary | ICD-10-CM | POA: Diagnosis not present

## 2018-04-28 DIAGNOSIS — C61 Malignant neoplasm of prostate: Secondary | ICD-10-CM | POA: Diagnosis not present

## 2018-06-02 DIAGNOSIS — Z1389 Encounter for screening for other disorder: Secondary | ICD-10-CM | POA: Diagnosis not present

## 2018-06-02 DIAGNOSIS — Z0000 Encounter for general adult medical examination without abnormal findings: Secondary | ICD-10-CM | POA: Diagnosis not present

## 2018-06-02 DIAGNOSIS — M519 Unspecified thoracic, thoracolumbar and lumbosacral intervertebral disc disorder: Secondary | ICD-10-CM | POA: Diagnosis not present

## 2018-06-02 DIAGNOSIS — E7800 Pure hypercholesterolemia, unspecified: Secondary | ICD-10-CM | POA: Diagnosis not present

## 2018-06-02 DIAGNOSIS — K219 Gastro-esophageal reflux disease without esophagitis: Secondary | ICD-10-CM | POA: Diagnosis not present

## 2018-06-02 DIAGNOSIS — C61 Malignant neoplasm of prostate: Secondary | ICD-10-CM | POA: Diagnosis not present

## 2018-06-30 DIAGNOSIS — M720 Palmar fascial fibromatosis [Dupuytren]: Secondary | ICD-10-CM | POA: Insufficient documentation
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
## 2018-07-09 DIAGNOSIS — Z0100 Encounter for examination of eyes and vision without abnormal findings: Secondary | ICD-10-CM | POA: Diagnosis not present

## 2018-11-03 DIAGNOSIS — C61 Malignant neoplasm of prostate: Secondary | ICD-10-CM | POA: Diagnosis not present

## 2018-11-10 DIAGNOSIS — N5201 Erectile dysfunction due to arterial insufficiency: Secondary | ICD-10-CM | POA: Diagnosis not present

## 2018-11-10 DIAGNOSIS — C61 Malignant neoplasm of prostate: Secondary | ICD-10-CM | POA: Diagnosis not present

## 2018-11-17 DIAGNOSIS — I10 Essential (primary) hypertension: Secondary | ICD-10-CM | POA: Diagnosis not present

## 2018-11-17 DIAGNOSIS — R079 Chest pain, unspecified: Secondary | ICD-10-CM | POA: Diagnosis not present

## 2018-11-17 MED FILL — ASPIRIN LOW DOSE 81 MG TBEC: 81 | 30 days supply | Qty: 30 | Fill #0

## 2018-11-17 MED FILL — NITROGLYCERIN 0.4 MG TAB SL: 0.4 | 8 days supply | Qty: 25 | Fill #0

## 2018-11-17 MED FILL — LISINOPRIL 10 MG TABLET: 10 | 30 days supply | Qty: 30 | Fill #0

## 2018-11-24 ENCOUNTER — Ambulatory Visit (INDEPENDENT_AMBULATORY_CARE_PROVIDER_SITE_OTHER): Payer: 59 | Admitting: Cardiology

## 2018-11-24 ENCOUNTER — Encounter: Payer: Self-pay | Admitting: Cardiology

## 2018-11-24 VITALS — BP 106/80 | HR 97 | Ht 67.0 in | Wt 175.2 lb

## 2018-11-24 DIAGNOSIS — E782 Mixed hyperlipidemia: Secondary | ICD-10-CM | POA: Diagnosis not present

## 2018-11-24 DIAGNOSIS — I10 Essential (primary) hypertension: Secondary | ICD-10-CM

## 2018-11-24 DIAGNOSIS — R0789 Other chest pain: Secondary | ICD-10-CM | POA: Insufficient documentation

## 2018-11-24 NOTE — Progress Notes (Signed)
Patient referred by Wenda Low , MD for chest/arm pain  Subjective:   '@Patient'  ID: Russell Jackson, male    DOB: [birth year], 63 y.o.   MRN: [id number]  Chief Complaint  Patient presents with  . New Patient (Initial Visit)  . Chest Pain  . Hypertension    HPI  63 year old African-American male with hypertension.  Self-employed, works in Conservation officer, nature. Lives in [place] with his wife. His job and church keep him busy, but does not do any regular exercise.   For past two weeks, has had episodes of retrosternal chest pain and tightness. It is most prominent while lying in bed. Pain does not get worse with exertion, but does get better with sitting and relaxing.   Patient is aware of his elevated cholesterol, but is making diet and lifestyle changes.  Reviewed outside records.  Labs 11/17/2018: Glucose 87, BUN/creatinine 18/1.1.  EGFR 81.  Sodium 140, potassium 4.3.  Rest of the CMP normal H/H 15/46.  MCV 92.  Platelets 184 Cholesterol 215, triglycerides 93, HDL 55, LDL 142  Past Medical History:  Diagnosis Date  . Cancer [facility])    prostate  . GERD (gastroesophageal reflux disease)    tx OTC meds as needed    Past Surgical History:  Procedure Laterality Date  . COLONOSCOPY WITH PROPOFOL N/A 07/22/2016   Procedure: COLONOSCOPY WITH PROPOFOL;  Surgeon: Garlan Fair, MD;  Location: [facility];  Service: Endoscopy;  Laterality: N/A;  . TRANSURETHRAL RESECTION OF PROSTATE      Social History   Socioeconomic History  . Marital status: Married    Spouse name: Not on file  . Number of children: Not on file  . Years of education: Not on file  . Highest education level: Not on file  Occupational History  . Not on file  Social Needs  . Financial resource strain: Not on file  . Food insecurity:    Worry: Not on file    Inability: Not on file  . Transportation needs:    Medical: Not on file    Non-medical: Not on file  Tobacco Use  . Smoking status:  Former Research scientist (life sciences)  . Smokeless tobacco: Never Used  . Tobacco comment: Quit age 57  Substance and Sexual Activity  . Alcohol use: No    Alcohol/week: 0.0 standard drinks  . Drug use: No  . Sexual activity: Not on file  Lifestyle  . Physical activity:    Days per week: Not on file    Minutes per session: Not on file  . Stress: Not on file  Relationships  . Social connections:    Talks on phone: Not on file    Gets together: Not on file    Attends religious service: Not on file    Active member of club or organization: Not on file    Attends meetings of clubs or organizations: Not on file    Relationship status: Not on file  . Intimate partner violence:    Fear of current or ex partner: Not on file    Emotionally abused: Not on file    Physically abused: Not on file    Forced sexual activity: Not on file  Other Topics Concern  . Not on file  Social History Narrative  . Not on file    Current Outpatient Medications on File Prior to Visit  Medication Sig Dispense Refill  . ASPIRIN LOW DOSE 81 MG EC tablet Take 1 tablet by mouth daily.    Marland Kitchen  lisinopril (PRINIVIL,ZESTRIL) 10 MG tablet Take 1 tablet by mouth daily.    . nitroGLYCERIN (NITROSTAT) 0.4 MG SL tablet Place 1 tablet under the tongue as needed.     No current facility-administered medications on file prior to visit.      Review of Systems  Constitution: Negative for decreased appetite, malaise/fatigue, weight gain and weight loss.  HENT:       Postnasal discharge  Eyes: Negative for visual disturbance.  Cardiovascular: Positive for chest pain. Negative for claudication, dyspnea on exertion, leg swelling and syncope.  Respiratory: Positive for cough (Dry ). Negative for shortness of breath.   Endocrine: Negative for cold intolerance.  Hematologic/Lymphatic: Does not bruise/bleed easily.  Skin: Negative for itching and rash.  Musculoskeletal: Negative for myalgias.       Left arm pain  Gastrointestinal: Negative for  abdominal pain, nausea and vomiting.  Genitourinary: Negative for dysuria.  Neurological: Negative for dizziness and weakness.  Psychiatric/Behavioral: The patient is not nervous/anxious.   All other systems reviewed and are negative.       There were no vitals filed for this visit.  Objective:   Physical Exam  Constitutional: He is oriented to person, place, and time. He appears well-developed and well-nourished. No distress.  HENT:  Head: Normocephalic and atraumatic.  Eyes: Pupils are equal, round, and reactive to light. Conjunctivae are normal.  Neck: No JVD present.  Cardiovascular: Normal rate, regular rhythm and intact distal pulses.  No murmur heard. Pulmonary/Chest: Effort normal and breath sounds normal. He has no wheezes. He has no rales.  Abdominal: Soft. Bowel sounds are normal. There is no rebound.  Musculoskeletal:        General: No edema.  Lymphadenopathy:    He has no cervical adenopathy.  Neurological: He is alert and oriented to person, place, and time. No cranial nerve deficit.  Skin: Skin is warm and dry.  Psychiatric: He has a normal mood and affect.  Nursing note and vitals reviewed.         Assessment & Recommendations:   Hypertension, unspecified type -  Plan: EKG 12-Lead EKG 11/24/2018: Sinus  Rhythm  -RSR(V1) -nondiagnostic.   BP controlled on lisinopril 10 mg daily. Continue the same.  Atypical chest pain -  While the pain is atypical, he does have risk for CAD- family h/o CAD, hypertension, hyperlipidemia Plan: PCV CARDIAC STRESS TEST  Mixed hyperlipidemia: 10 yr ASCVD risk 9%. Patient would like to make diet and lifestyle changes. Repeat lipid panel in 6 months with PCP.   I will see him on as needed basis, unless stress test is abnormal.   Thank you for referring the patient to [place]. Please feel free to contact with any questions.  Nigel Mormon, MD [facility] Pager: [phone] Office: [phone] If  no answer Cell [phone]

## 2018-11-24 NOTE — Patient Instructions (Signed)

## 2018-12-08 ENCOUNTER — Ambulatory Visit: Payer: 59

## 2018-12-08 DIAGNOSIS — R0789 Other chest pain: Secondary | ICD-10-CM | POA: Diagnosis not present

## 2018-12-15 DIAGNOSIS — I10 Essential (primary) hypertension: Secondary | ICD-10-CM | POA: Diagnosis not present

## 2018-12-15 MED FILL — LISINOPRIL 10 MG TABLET: 10 | 90 days supply | Qty: 45 | Fill #0

## 2019-01-19 DIAGNOSIS — J309 Allergic rhinitis, unspecified: Secondary | ICD-10-CM | POA: Diagnosis not present

## 2019-01-19 DIAGNOSIS — I10 Essential (primary) hypertension: Secondary | ICD-10-CM | POA: Diagnosis not present

## 2019-01-19 MED FILL — FLUTICASONE PROP 50 MCG SPR: 50 | 30 days supply | Qty: 16 | Fill #0

## 2019-02-22 DIAGNOSIS — H9203 Otalgia, bilateral: Secondary | ICD-10-CM | POA: Diagnosis not present

## 2019-02-22 DIAGNOSIS — H9319 Tinnitus, unspecified ear: Secondary | ICD-10-CM | POA: Diagnosis not present

## 2019-02-28 DIAGNOSIS — M26623 Arthralgia of bilateral temporomandibular joint: Secondary | ICD-10-CM | POA: Insufficient documentation

## 2019-02-28 DIAGNOSIS — Z7289 Other problems related to lifestyle: Secondary | ICD-10-CM | POA: Diagnosis not present

## 2019-02-28 DIAGNOSIS — H9313 Tinnitus, bilateral: Secondary | ICD-10-CM | POA: Insufficient documentation

## 2019-02-28 DIAGNOSIS — H938X3 Other specified disorders of ear, bilateral: Secondary | ICD-10-CM | POA: Diagnosis not present

## 2019-02-28 DIAGNOSIS — J343 Hypertrophy of nasal turbinates: Secondary | ICD-10-CM | POA: Diagnosis not present

## 2019-03-02 DIAGNOSIS — C61 Malignant neoplasm of prostate: Secondary | ICD-10-CM | POA: Diagnosis not present

## 2019-03-09 DIAGNOSIS — C61 Malignant neoplasm of prostate: Secondary | ICD-10-CM | POA: Diagnosis not present

## 2019-03-09 DIAGNOSIS — N5201 Erectile dysfunction due to arterial insufficiency: Secondary | ICD-10-CM | POA: Diagnosis not present

## 2019-03-09 MED FILL — SILDENAFIL CITRATE 100 MG T: 100 | 30 days supply | Qty: 6 | Fill #0

## 2019-03-15 MED FILL — LISINOPRIL 10 MG TABLET: 10 | 90 days supply | Qty: 45 | Fill #1

## 2019-06-06 DIAGNOSIS — I10 Essential (primary) hypertension: Secondary | ICD-10-CM | POA: Diagnosis not present

## 2019-06-06 DIAGNOSIS — Z1389 Encounter for screening for other disorder: Secondary | ICD-10-CM | POA: Diagnosis not present

## 2019-06-06 DIAGNOSIS — Z0000 Encounter for general adult medical examination without abnormal findings: Secondary | ICD-10-CM | POA: Diagnosis not present

## 2019-06-06 DIAGNOSIS — K219 Gastro-esophageal reflux disease without esophagitis: Secondary | ICD-10-CM | POA: Diagnosis not present

## 2019-06-06 DIAGNOSIS — E7800 Pure hypercholesterolemia, unspecified: Secondary | ICD-10-CM | POA: Diagnosis not present

## 2019-06-06 DIAGNOSIS — C61 Malignant neoplasm of prostate: Secondary | ICD-10-CM | POA: Diagnosis not present

## 2019-06-06 DIAGNOSIS — F419 Anxiety disorder, unspecified: Secondary | ICD-10-CM | POA: Diagnosis not present

## 2019-06-06 DIAGNOSIS — Z23 Encounter for immunization: Secondary | ICD-10-CM | POA: Diagnosis not present

## 2019-06-09 MED FILL — LISINOPRIL 10 MG TABS: 10 | 90 days supply | Qty: 45 | Fill #2

## 2019-06-09 MED FILL — SILDENAFIL CITRATE 100 MG T: 100 | 30 days supply | Qty: 6 | Fill #1

## 2019-08-14 DIAGNOSIS — Z0100 Encounter for examination of eyes and vision without abnormal findings: Secondary | ICD-10-CM | POA: Diagnosis not present

## 2019-09-14 DIAGNOSIS — C61 Malignant neoplasm of prostate: Secondary | ICD-10-CM | POA: Diagnosis not present

## 2019-09-16 MED FILL — LISINOPRIL 10 MG TABS: 10 | 90 days supply | Qty: 45 | Fill #3

## 2019-09-22 DIAGNOSIS — C61 Malignant neoplasm of prostate: Secondary | ICD-10-CM | POA: Diagnosis not present

## 2019-09-22 DIAGNOSIS — N5201 Erectile dysfunction due to arterial insufficiency: Secondary | ICD-10-CM | POA: Diagnosis not present

## 2019-10-22 ENCOUNTER — Encounter (HOSPITAL_COMMUNITY): Payer: Self-pay

## 2019-10-22 ENCOUNTER — Ambulatory Visit (INDEPENDENT_AMBULATORY_CARE_PROVIDER_SITE_OTHER): Payer: 59

## 2019-10-22 ENCOUNTER — Ambulatory Visit (HOSPITAL_COMMUNITY)
Admission: EM | Admit: 2019-10-22 | Discharge: 2019-10-22 | Disposition: A | Discharge status: Home / Self Care | Payer: 59 | Attending: Family Medicine | Admitting: Family Medicine

## 2019-10-22 ENCOUNTER — Other Ambulatory Visit: Payer: Self-pay

## 2019-10-22 DIAGNOSIS — I10 Essential (primary) hypertension: Secondary | ICD-10-CM | POA: Insufficient documentation

## 2019-10-22 DIAGNOSIS — Z20822 Contact with and (suspected) exposure to covid-19: Secondary | ICD-10-CM | POA: Diagnosis not present

## 2019-10-22 DIAGNOSIS — R0981 Nasal congestion: Secondary | ICD-10-CM | POA: Insufficient documentation

## 2019-10-22 DIAGNOSIS — R079 Chest pain, unspecified: Secondary | ICD-10-CM

## 2019-10-22 DIAGNOSIS — R05 Cough: Secondary | ICD-10-CM | POA: Diagnosis not present

## 2019-10-22 DIAGNOSIS — K219 Gastro-esophageal reflux disease without esophagitis: Secondary | ICD-10-CM | POA: Diagnosis not present

## 2019-10-22 DIAGNOSIS — Z8249 Family history of ischemic heart disease and other diseases of the circulatory system: Secondary | ICD-10-CM | POA: Diagnosis not present

## 2019-10-22 DIAGNOSIS — R0982 Postnasal drip: Secondary | ICD-10-CM | POA: Diagnosis not present

## 2019-10-22 DIAGNOSIS — R5383 Other fatigue: Secondary | ICD-10-CM | POA: Diagnosis not present

## 2019-10-22 DIAGNOSIS — Z87891 Personal history of nicotine dependence: Secondary | ICD-10-CM | POA: Diagnosis not present

## 2019-10-22 DIAGNOSIS — R059 Cough, unspecified: Secondary | ICD-10-CM

## 2019-10-22 MED ORDER — BENZONATATE 100 MG PO CAPS: ORAL_CAPSULE | ORAL | 0 refills | Status: DC

## 2019-10-22 MED FILL — BENZONATATE 100 MG CAPS: 100 | 7 days supply | Qty: 21 | Fill #0

## 2019-10-22 NOTE — Discharge Instructions (Signed)
If your Covid-19 test is positive, you will receive a phone call from  regarding your results. Negative test results are not called. Both positive and negative results area always visible on MyChart. If you do not have a MyChart account, sign up instructions are in your discharge papers.  

## 2019-10-22 NOTE — ED Triage Notes (Signed)
Patient presents to Urgent Care with complaints of nasal congestion, nausea, generalized body aches, and left sided chest pains with deep inspiration since about 4 days ago. Patient reports his appetite has been good, his cough is worse first thing in the morning (productive).

## 2019-10-23 LAB — NOVEL CORONAVIRUS, NAA (HOSP ORDER, SEND-OUT TO REF LAB; TAT 18-24 HRS): SARS-CoV-2, NAA: NOT DETECTED

## 2019-10-24 NOTE — ED Provider Notes (Signed)
[place]   [id number] 10/22/19 Arrival Time: [id number]  ASSESSMENT & PLAN:  1. Chest pain, unspecified type   2. Cough   3. Nasal congestion     I have personally viewed the imaging studies ordered this visit. No acute or worrisome changes.  COVID testing sent. See work note for self-isolation instructions pending test results.  Meds ordered this encounter  Medications  . benzonatate (TESSALON) 100 MG capsule    Sig: Take 1 capsule by mouth every 8 (eight) hours for cough.    Dispense:  21 capsule    Refill:  0    Discussed typical duration of symptoms. OTC symptom care as needed. Ensure adequate fluid intake and rest. May f/u with PCP or here as needed.  Reviewed expectations re: course of current medical issues. Questions answered. Outlined signs and symptoms indicating need for more acute intervention. Patient verbalized understanding. After Visit Summary given.   SUBJECTIVE: History from: patient.   Onesimo Savino is a 64 y.o. male who presents with complaint of nasal congestion, post-nasal drainage, and a dry cough. Occasional L-sided chest pain with deep inspirations; none with exertion or at rest. No sore throat. Onset abrupt, 3-4 d ago; with mild fatigue and with body aches. SOB: none. Wheezing: none. Fever: absent. Overall normal PO intake without n/v. Known sick contacts or COVID-19 exposure: no. No specific or significant aggravating or alleviating factors reported. OTC treatment: none reported.   Social History   Tobacco Use  Smoking Status Former Smoker  . Packs/day: 1.00  . Years: 6.00  . Pack years: 6.00  . Types: Cigarettes  Smokeless Tobacco Never Used  Tobacco Comment   Quit age 24    ROS: As per HPI. All other systems negative.    OBJECTIVE:  Vitals:   10/22/19 1235  BP: (!) 130/99  Pulse: 75  Resp: 16  Temp: 98.1 F (36.7 C)  TempSrc: Oral  SpO2: 100%     General appearance: alert; appears fatigued HEENT: nasal  congestion; clear runny nose; throat irritation secondary to post-nasal drainage Neck: supple without LAD CV: regular Lungs: unlabored respirations, symmetrical air entry without wheezing; cough: mild, dry Abd: soft Ext: no LE edema Skin: warm and dry Neuro: normal gait Psychological: alert and cooperative; normal mood and affect  Imaging: DG Chest 2 View  Result Date: 10/22/2019 CLINICAL DATA:  Per pt: fells bad, productive cough, shakes, nausea, no V/D, fatigue, body aches. No fever. No covid testing. No history of cardiac or respiratory disease. EXAM: CHEST - 2 VIEW COMPARISON:  Chest radiograph 04/04/2018 FINDINGS: The heart size and mediastinal contours are within normal limits. The lungs are clear. No pneumothorax or pleural effusion. The visualized skeletal structures are unremarkable. IMPRESSION: No active cardiopulmonary disease. Electronically Signed   By: Audie Pinto M.D.   On: 10/22/2019 13:24    Allergies  Allergen Reactions  . Naproxen Sodium Rash    Past Medical History:  Diagnosis Date  . Cancer [facility])    prostate  . GERD (gastroesophageal reflux disease)    tx OTC meds as needed  . Hepatitis A   . Hypertension    Family History  Problem Relation Age of Onset  . Hypertension Mother   . COPD Mother   . Prostate cancer Father   . Emphysema Father   . Asthma Sister   . Prostate cancer Brother   . Breast cancer Sister   . Heart attack Maternal Grandmother        Died at  age 46   Social History   Socioeconomic History  . Marital status: Married    Spouse name: Not on file  . Number of children: 2  . Years of education: Not on file  . Highest education level: Not on file  Occupational History  . Not on file  Tobacco Use  . Smoking status: Former Smoker    Packs/day: 1.00    Years: 6.00    Pack years: 6.00    Types: Cigarettes  . Smokeless tobacco: Never Used  . Tobacco comment: Quit age 83  Substance and Sexual Activity  . Alcohol use: Yes     Alcohol/week: 0.0 standard drinks    Comment: Occasional glass of wine  . Drug use: No  . Sexual activity: Not on file  Other Topics Concern  . Not on file  Social History Narrative  . Not on file   Social Determinants of Health   Financial Resource Strain:   . Difficulty of Paying Living Expenses: Not on file  Food Insecurity:   . Worried About Charity fundraiser in the Last Year: Not on file  . Ran Out of Food in the Last Year: Not on file  Transportation Needs:   . Lack of Transportation (Medical): Not on file  . Lack of Transportation (Non-Medical): Not on file  Physical Activity:   . Days of Exercise per Week: Not on file  . Minutes of Exercise per Session: Not on file  Stress:   . Feeling of Stress : Not on file  Social Connections:   . Frequency of Communication with Friends and Family: Not on file  . Frequency of Social Gatherings with Friends and Family: Not on file  . Attends Religious Services: Not on file  . Active Member of Clubs or Organizations: Not on file  . Attends Archivist Meetings: Not on file  . Marital Status: Not on file  Intimate Partner Violence:   . Fear of Current or Ex-Partner: Not on file  . Emotionally Abused: Not on file  . Physically Abused: Not on file  . Sexually Abused: Not on file           Vanessa Kick, MD 10/24/19 1221

## 2019-12-16 MED FILL — LISINOPRIL 10 MG TABS: 10 | 90 days supply | Qty: 45 | Fill #0

## 2020-01-20 IMAGING — DX DG CHEST 2V
2 series · 2 of 2 positions shown · non-contrast
Comparison: Chest radiograph 04/04/2018

CLINICAL DATA: Per pt: fells bad, productive cough, shakes, nausea,
no V/D, fatigue, body aches. No fever. No covid testing. No history
of cardiac or respiratory disease.

EXAM:
CHEST - 2 VIEW

[chest pa]
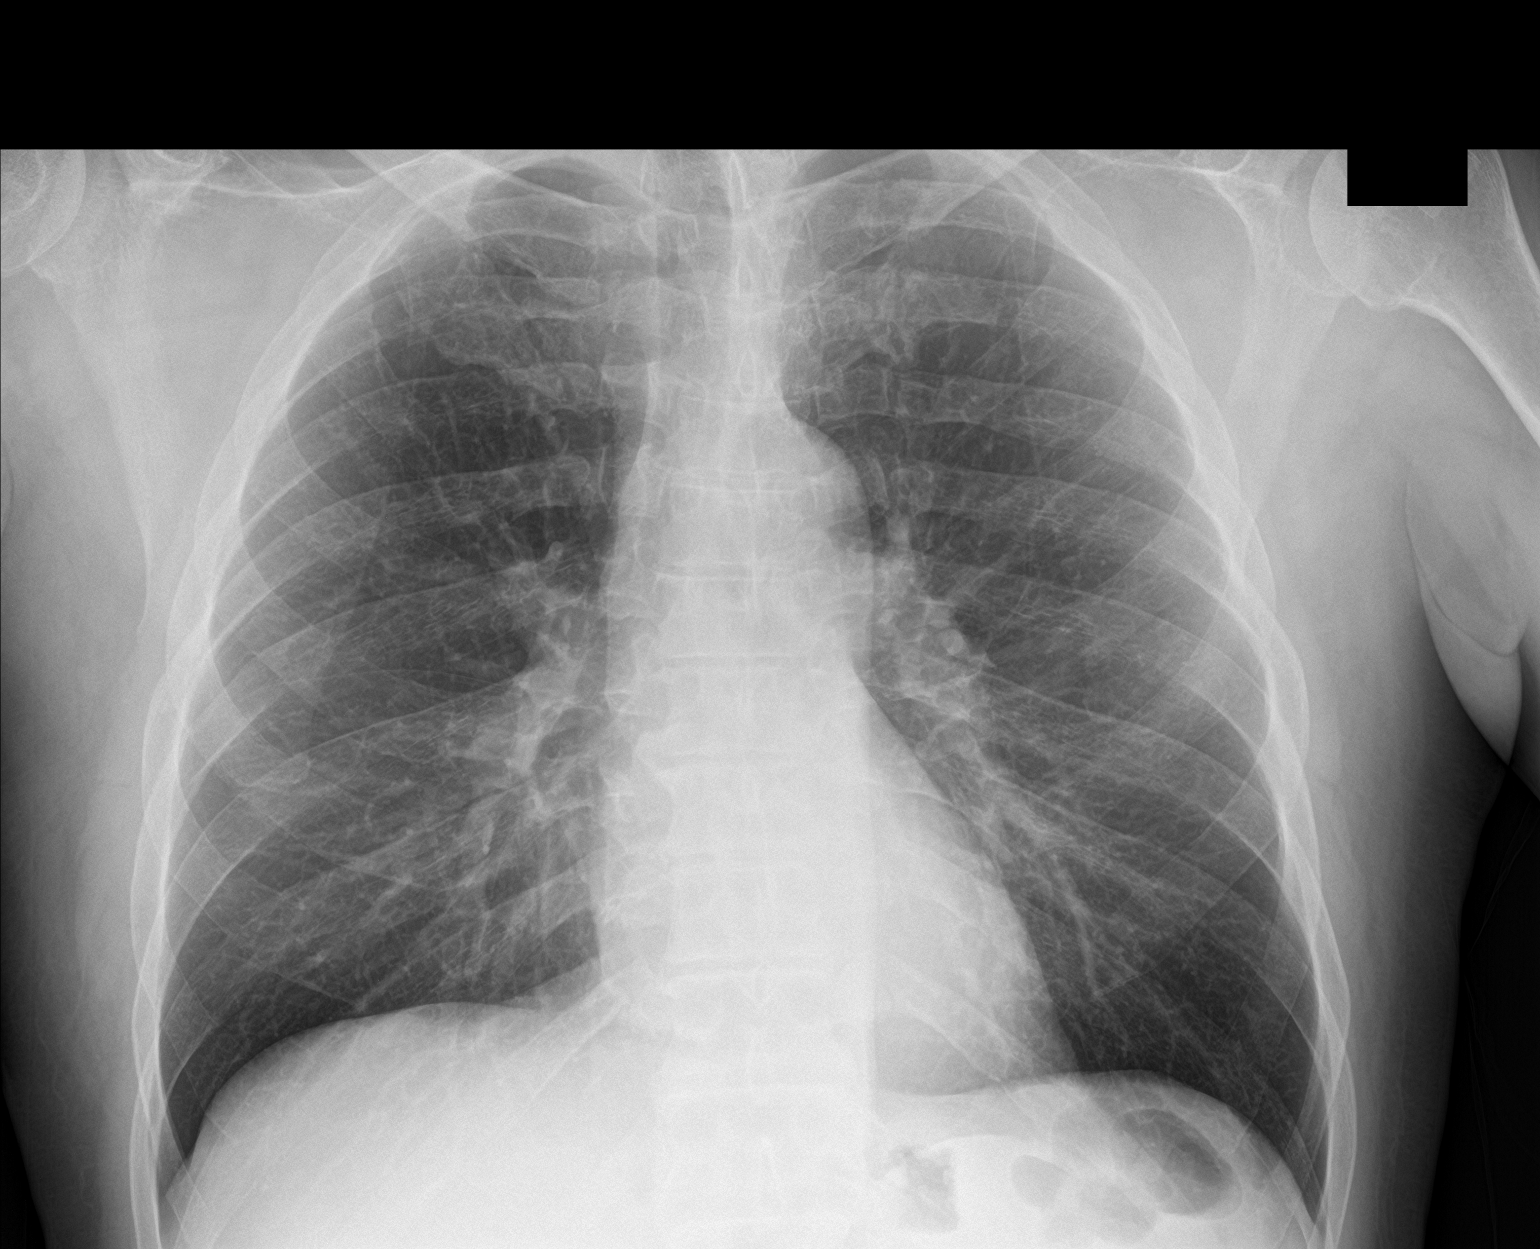

[chest lat]
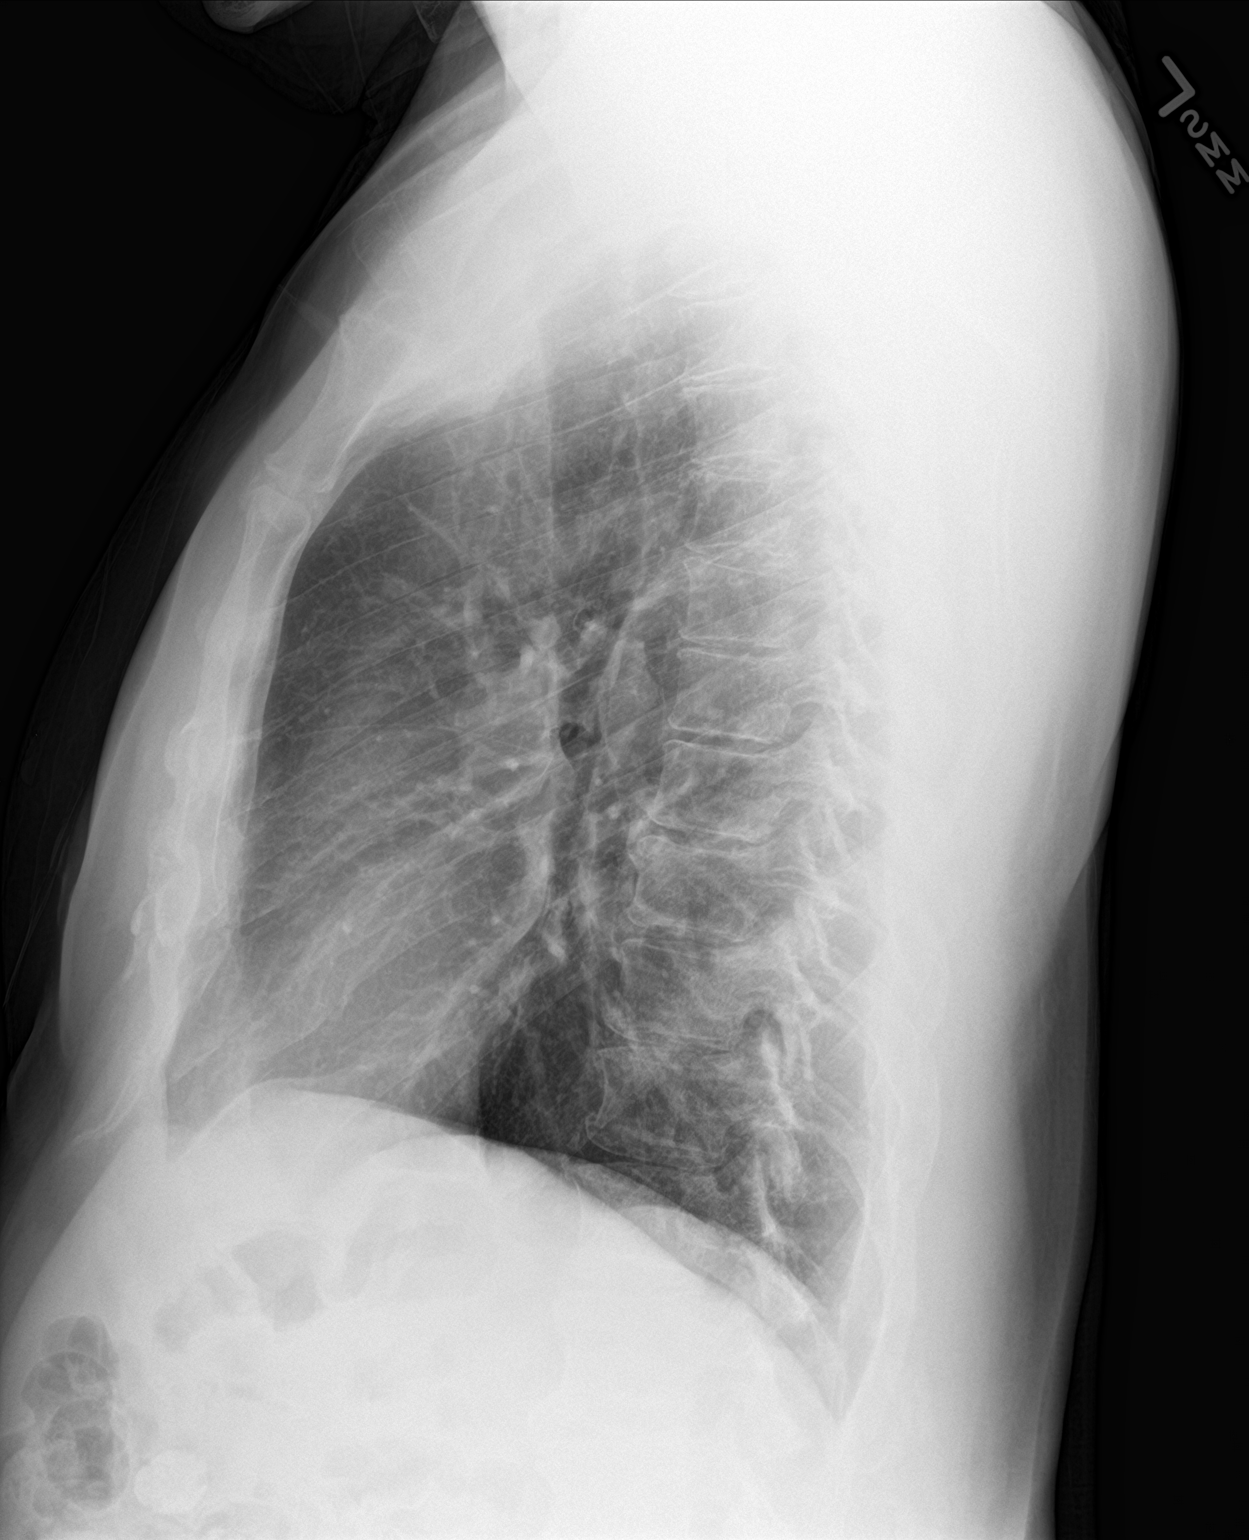

[2 of 2 positions shown; findings below may reference images not displayed]

FINDINGS: The heart size and mediastinal contours are within normal limits.
The lungs are clear. No pneumothorax or pleural effusion. The
visualized skeletal structures are unremarkable.
IMPRESSION: No active cardiopulmonary disease.

## 2020-03-14 DIAGNOSIS — C61 Malignant neoplasm of prostate: Secondary | ICD-10-CM | POA: Diagnosis not present

## 2020-03-15 MED FILL — LISINOPRIL 10 MG TABS: 10 | 90 days supply | Qty: 45 | Fill #1

## 2020-03-21 DIAGNOSIS — N5201 Erectile dysfunction due to arterial insufficiency: Secondary | ICD-10-CM | POA: Diagnosis not present

## 2020-03-21 DIAGNOSIS — C61 Malignant neoplasm of prostate: Secondary | ICD-10-CM | POA: Diagnosis not present

## 2020-03-21 MED FILL — TADALAFIL 5 MG TABS: 5 | 30 days supply | Qty: 30 | Fill #0

## 2020-06-06 DIAGNOSIS — Z1389 Encounter for screening for other disorder: Secondary | ICD-10-CM | POA: Diagnosis not present

## 2020-06-06 DIAGNOSIS — K219 Gastro-esophageal reflux disease without esophagitis: Secondary | ICD-10-CM | POA: Diagnosis not present

## 2020-06-06 DIAGNOSIS — E7800 Pure hypercholesterolemia, unspecified: Secondary | ICD-10-CM | POA: Diagnosis not present

## 2020-06-06 DIAGNOSIS — I10 Essential (primary) hypertension: Secondary | ICD-10-CM | POA: Diagnosis not present

## 2020-06-06 DIAGNOSIS — C61 Malignant neoplasm of prostate: Secondary | ICD-10-CM | POA: Diagnosis not present

## 2020-06-06 DIAGNOSIS — J309 Allergic rhinitis, unspecified: Secondary | ICD-10-CM | POA: Diagnosis not present

## 2020-06-06 DIAGNOSIS — Z0000 Encounter for general adult medical examination without abnormal findings: Secondary | ICD-10-CM | POA: Diagnosis not present

## 2020-06-11 MED FILL — LISINOPRIL 10 MG TABS: 10 | 90 days supply | Qty: 45 | Fill #2

## 2020-07-10 ENCOUNTER — Other Ambulatory Visit: Payer: Self-pay | Admitting: Internal Medicine

## 2020-07-10 ENCOUNTER — Ambulatory Visit
Admission: RE | Admit: 2020-07-10 | Discharge: 2020-07-10 | Disposition: A | Discharge status: Home / Self Care | Payer: 59 | Source: Ambulatory Visit | Attending: Internal Medicine | Admitting: Internal Medicine

## 2020-07-10 ENCOUNTER — Other Ambulatory Visit: Payer: Self-pay

## 2020-07-10 DIAGNOSIS — M25562 Pain in left knee: Secondary | ICD-10-CM

## 2020-07-10 DIAGNOSIS — M2550 Pain in unspecified joint: Secondary | ICD-10-CM | POA: Diagnosis not present

## 2020-07-10 DIAGNOSIS — M1711 Unilateral primary osteoarthritis, right knee: Secondary | ICD-10-CM | POA: Diagnosis not present

## 2020-07-10 DIAGNOSIS — M25569 Pain in unspecified knee: Secondary | ICD-10-CM | POA: Diagnosis not present

## 2020-07-10 DIAGNOSIS — M1712 Unilateral primary osteoarthritis, left knee: Secondary | ICD-10-CM | POA: Diagnosis not present

## 2020-08-21 DIAGNOSIS — Z0100 Encounter for examination of eyes and vision without abnormal findings: Secondary | ICD-10-CM | POA: Diagnosis not present

## 2020-09-10 ENCOUNTER — Other Ambulatory Visit (HOSPITAL_COMMUNITY): Payer: Self-pay | Admitting: Internal Medicine

## 2020-09-10 MED FILL — LISINOPRIL 10 MG TABS: 10 | 90 days supply | Qty: 45 | Fill #0

## 2020-09-15 DIAGNOSIS — Z23 Encounter for immunization: Secondary | ICD-10-CM | POA: Diagnosis not present

## 2020-10-17 DIAGNOSIS — C61 Malignant neoplasm of prostate: Secondary | ICD-10-CM | POA: Diagnosis not present

## 2020-10-24 DIAGNOSIS — C61 Malignant neoplasm of prostate: Secondary | ICD-10-CM | POA: Diagnosis not present

## 2020-10-26 ENCOUNTER — Other Ambulatory Visit: Payer: 59

## 2020-10-26 DIAGNOSIS — Z20822 Contact with and (suspected) exposure to covid-19: Secondary | ICD-10-CM

## 2020-10-30 LAB — NOVEL CORONAVIRUS, NAA: SARS-CoV-2, NAA: NOT DETECTED

## 2020-11-21 DIAGNOSIS — C61 Malignant neoplasm of prostate: Secondary | ICD-10-CM | POA: Diagnosis not present

## 2020-11-28 ENCOUNTER — Other Ambulatory Visit (HOSPITAL_COMMUNITY): Payer: Self-pay | Admitting: Urology

## 2020-11-28 DIAGNOSIS — C61 Malignant neoplasm of prostate: Secondary | ICD-10-CM

## 2020-12-12 ENCOUNTER — Ambulatory Visit (HOSPITAL_COMMUNITY)
Admission: RE | Admit: 2020-12-12 | Discharge: 2020-12-12 | Disposition: A | Discharge status: Home / Self Care | Payer: 59 | Source: Ambulatory Visit | Attending: Urology | Admitting: Urology

## 2020-12-12 ENCOUNTER — Other Ambulatory Visit: Payer: Self-pay

## 2020-12-12 DIAGNOSIS — C61 Malignant neoplasm of prostate: Secondary | ICD-10-CM | POA: Diagnosis not present

## 2020-12-12 MED ORDER — PIFLIFOLASTAT F 18 (PYLARIFY) INJECTION
9.0000 | Freq: Once | INTRAVENOUS | Status: AC
  Administered 2020-12-12: 9.3 via INTRAVENOUS

## 2021-01-14 ENCOUNTER — Encounter: Payer: Self-pay | Admitting: Radiation Oncology

## 2021-01-14 DIAGNOSIS — C61 Malignant neoplasm of prostate: Secondary | ICD-10-CM | POA: Insufficient documentation

## 2021-01-14 DIAGNOSIS — K219 Gastro-esophageal reflux disease without esophagitis: Secondary | ICD-10-CM | POA: Insufficient documentation

## 2021-01-14 DIAGNOSIS — F419 Anxiety disorder, unspecified: Secondary | ICD-10-CM | POA: Insufficient documentation

## 2021-01-14 DIAGNOSIS — Z86010 Personal history of colonic polyps: Secondary | ICD-10-CM | POA: Insufficient documentation

## 2021-01-14 DIAGNOSIS — E7800 Pure hypercholesterolemia, unspecified: Secondary | ICD-10-CM | POA: Insufficient documentation

## 2021-01-14 DIAGNOSIS — H9319 Tinnitus, unspecified ear: Secondary | ICD-10-CM | POA: Insufficient documentation

## 2021-01-14 DIAGNOSIS — J309 Allergic rhinitis, unspecified: Secondary | ICD-10-CM | POA: Insufficient documentation

## 2021-01-14 DIAGNOSIS — M519 Unspecified thoracic, thoracolumbar and lumbosacral intervertebral disc disorder: Secondary | ICD-10-CM | POA: Insufficient documentation

## 2021-01-14 DIAGNOSIS — N200 Calculus of kidney: Secondary | ICD-10-CM | POA: Insufficient documentation

## 2021-01-14 DIAGNOSIS — R9721 Rising PSA following treatment for malignant neoplasm of prostate: Secondary | ICD-10-CM | POA: Insufficient documentation

## 2021-01-14 DIAGNOSIS — N400 Enlarged prostate without lower urinary tract symptoms: Secondary | ICD-10-CM | POA: Insufficient documentation

## 2021-01-14 DIAGNOSIS — Z5111 Encounter for antineoplastic chemotherapy: Secondary | ICD-10-CM | POA: Diagnosis not present

## 2021-01-14 DIAGNOSIS — I8390 Asymptomatic varicose veins of unspecified lower extremity: Secondary | ICD-10-CM | POA: Insufficient documentation

## 2021-01-14 NOTE — Progress Notes (Signed)
GU Location of Tumor / Histology: Biochemical recurrent prostate cancer  If Prostate Cancer, Gleason Score is (3 + 4) and PSA is (6.22) pre-treatment  Date Received: 09/18/2008   FINAL DIAGNOSIS   MICROSCOPIC EXAMINATION AND DIAGNOSIS  1. PROSTATE, RADICAL RESECTION:  - PROSTATIC ADENOCARCINOMA, GLEASON' S SCORE 3+4 = 7/10,  INVOLVING BOTH PROSTATE  LOBES.  - PERINEURAL INVASION IS IDENTIFIED.  - THE SURGICAL RESECTION MARGINS ARE NEGATIVE FOR  ADENOCARCINOMA.  - SEE ONCOLOGY TABLE BELOW.   2. LYMPH NODES, LEFT PELVIC, REGIONAL RESECTION:  - THERE IS NO EVIDENCE OF CARCINOMA IN 4 OF 4 LYMPH NODES  (0/4).   3. LYMPH NODES, RIGHT PELVIC, REGIONAL RESECTION:  - THERE IS NO EVIDENCE OF CARCINOMA IN 7 OF 7 LYMPH NODES  (0/7).   11/21/2020 PSA 0.31  Past/Anticipated interventions by urology, if any: prostate biopsy, prostatectomy (negative margins), active surveillance, referral for consideration of salvage radiation  Past/Anticipated interventions by medical oncology, if any: no  Weight changes, if any: denies  Bowel/Bladder complaints, if any: IPSS 2. SHIM 5. Denies dysuria or hematuria. Reports urinary leakage r/t effects of prostatectomy.   Nausea/Vomiting, if any: denies  Pain issues, if any:  denies  SAFETY ISSUES:  Prior radiation? denies  Pacemaker/ICD? denies  Possible current pregnancy? no, male patient  Is the patient on methotrexate? denies  Current Complaints / other details:  65 year old male. Married. Patient has 2 sons from another relationship. GENETICS?  Res[place], [place].  Father & brother - prostate cancer. Sister - breast cancer.

## 2021-01-15 ENCOUNTER — Other Ambulatory Visit: Payer: Self-pay

## 2021-01-15 ENCOUNTER — Encounter: Payer: Self-pay | Admitting: Medical Oncology

## 2021-01-15 ENCOUNTER — Ambulatory Visit
Admission: RE | Admit: 2021-01-15 | Discharge: 2021-01-15 | Disposition: A | Discharge status: Home / Self Care | Payer: 59 | Source: Ambulatory Visit | Attending: Radiation Oncology | Admitting: Radiation Oncology

## 2021-01-15 ENCOUNTER — Encounter: Payer: Self-pay | Admitting: Radiation Oncology

## 2021-01-15 VITALS — BP 138/99 | HR 105 | Temp 97.6°F | Resp 18 | Ht 67.0 in | Wt 187.0 lb

## 2021-01-15 DIAGNOSIS — Z87891 Personal history of nicotine dependence: Secondary | ICD-10-CM | POA: Insufficient documentation

## 2021-01-15 DIAGNOSIS — K219 Gastro-esophageal reflux disease without esophagitis: Secondary | ICD-10-CM | POA: Diagnosis not present

## 2021-01-15 DIAGNOSIS — B159 Hepatitis A without hepatic coma: Secondary | ICD-10-CM | POA: Diagnosis not present

## 2021-01-15 DIAGNOSIS — Z803 Family history of malignant neoplasm of breast: Secondary | ICD-10-CM | POA: Diagnosis not present

## 2021-01-15 DIAGNOSIS — R9721 Rising PSA following treatment for malignant neoplasm of prostate: Secondary | ICD-10-CM | POA: Diagnosis not present

## 2021-01-15 DIAGNOSIS — C61 Malignant neoplasm of prostate: Secondary | ICD-10-CM | POA: Insufficient documentation

## 2021-01-15 DIAGNOSIS — Z8042 Family history of malignant neoplasm of prostate: Secondary | ICD-10-CM | POA: Diagnosis not present

## 2021-01-15 DIAGNOSIS — Z79899 Other long term (current) drug therapy: Secondary | ICD-10-CM | POA: Diagnosis not present

## 2021-01-15 DIAGNOSIS — Z800 Family history of malignant neoplasm of digestive organs: Secondary | ICD-10-CM | POA: Diagnosis not present

## 2021-01-15 HISTORY — DX: Malignant neoplasm of prostate: C61

## 2021-01-15 NOTE — Progress Notes (Signed)
Radiation Oncology         [phone] ________________________________  Initial outpatient Consultation  Name: Russell Jackson MRN: [id number]  Date of Service: 01/15/2021 DOB: [birth year]  XF:GHWEXH, Denton Ar, MD  Raynelle Bring, MD   REFERRING PHYSICIAN: Raynelle Bring, MD  DIAGNOSIS: 65 y/o male with biochemical recurrence of prostate cancer with PSA at 0.31 s/p RALP in 08/2008 for pT2c, Gleason 3+4, adenocarcinoma of the prostate.    ICD-10-CM   1. Malignant neoplasm of prostate ([place])  C61   2. Biochemically recurrent malignant neoplasm of prostate [facility])  C61    R97.21     HISTORY OF PRESENT ILLNESS: Russell Jackson is a 65 y.o. male seen at the request of Dr. Alinda Money. He was initially diagnosed with stage T1c Gleason 3+4 prostate cancer on biopsy in 04/2008 with a PSA of 6.22. We met with the patient at that time to discuss treatment options but he ultimately decided to proceed with RALP on 09/18/08 under the care of Dr. Alinda Money.  Final surgical pathology confirmed pT2c,N0,  Gleason 3+4, prostatic adenocarcinoma involving both prostate lobes with perineural invasion identified but negative surgical margins and all lymph nodes negative for carcinoma (0/11). His initial postoperative PSA was undetectable and remained undetectable until 12/2015 when it became very low detectable at 0.02. He continued with close monitoring of the PSA which reached 0.14 in 10/2018 and remained stable in that range through 02/2020. The PSA reached 0.29 in 09/2020 and further increased to 0.31 in 11/2020.  He underwent PSMA scan on 12/12/20 for disease restaging and this showed no evidence of local recurrence, metastatic adenopathy, or skeletal metastasis. He reviewed the imaging and PSA results with his urologist and has been kindly referred today to discuss the potential role for salvage radiotherapy, concurrent with ADT which was started 01/14/21.  PREVIOUS RADIATION THERAPY: No  PAST MEDICAL HISTORY:  Past Medical History:   Diagnosis Date  . GERD (gastroesophageal reflux disease)    tx OTC meds as needed  . Hepatitis A   . Hypertension   . Prostate cancer ([place])       PAST SURGICAL HISTORY: Past Surgical History:  Procedure Laterality Date  . COLONOSCOPY WITH PROPOFOL N/A 07/22/2016   Procedure: COLONOSCOPY WITH PROPOFOL;  Surgeon: Garlan Fair, MD;  Location: [facility];  Service: Endoscopy;  Laterality: N/A;  . TRANSURETHRAL RESECTION OF PROSTATE      FAMILY HISTORY:  Family History  Problem Relation Age of Onset  . Hypertension Mother   . COPD Mother   . Prostate cancer Father   . Emphysema Father   . Asthma Sister   . Prostate cancer Brother   . Breast cancer Sister   . Heart attack Maternal Grandmother        Died at age 23  . Colon cancer Neg Hx   . Pancreatic cancer Neg Hx     SOCIAL HISTORY:  Social History   Socioeconomic History  . Marital status: Married    Spouse name: Not on file  . Number of children: 2  . Years of education: Not on file  . Highest education level: Not on file  Occupational History  . Not on file  Tobacco Use  . Smoking status: Former Smoker    Packs/day: 1.00    Years: 6.00    Pack years: 6.00    Types: Cigarettes    Quit date: 10/20/1980    Years since quitting: 40.2  . Smokeless tobacco: Never Used  . Tobacco comment: Quit  age 33  Vaping Use  . Vaping Use: Never used  Substance and Sexual Activity  . Alcohol use: Yes    Alcohol/week: 0.0 standard drinks    Comment: Occasional glass of wine  . Drug use: No  . Sexual activity: Not Currently  Other Topics Concern  . Not on file  Social History Narrative  . Not on file   Social Determinants of Health   Financial Resource Strain: Not on file  Food Insecurity: Not on file  Transportation Needs: Not on file  Physical Activity: Not on file  Stress: Not on file  Social Connections: Not on file  Intimate Partner Violence: Not on file    ALLERGIES: Naproxen sodium  MEDICATIONS:   Current Outpatient Medications  Medication Sig Dispense Refill  . ASPIRIN LOW DOSE 81 MG EC tablet Take 1 tablet by mouth daily.    . cetirizine (ZYRTEC) 10 MG tablet Take by mouth.    . cholecalciferol (VITAMIN D3) 25 MCG (1000 UNIT) tablet 1 tablet    . fluticasone (FLONASE) 50 MCG/ACT nasal spray     . lisinopril (PRINIVIL,ZESTRIL) 10 MG tablet Take 1 tablet by mouth daily.    Marland Kitchen omeprazole (PRILOSEC OTC) 20 MG tablet 1 tablet    . nitroGLYCERIN (NITROSTAT) 0.4 MG SL tablet Place 1 tablet under the tongue as needed. (Patient not taking: Reported on 01/15/2021)     No current facility-administered medications for this encounter.    REVIEW OF SYSTEMS:  On review of systems, the patient reports that he is doing well overall. He denies any chest pain, shortness of breath, cough, fevers, chills, night sweats, unintended weight changes. He denies any bowel or bladder disturbances, and denies abdominal pain, nausea or vomiting. He denies any new musculoskeletal or joint aches or pains. His IPSS score was 2, indicating mild urinary symptoms. He reports some chronic occasional small volume urge incontinence present since the time of his prostatectomy but does not require pads for protection at this point. His SHIM was 5, indicating he has severe erectile dysfunction present since the time of surgery. A complete review of systems is obtained and is otherwise negative.    PHYSICAL EXAM:  Wt Readings from Last 3 Encounters:  01/15/21 187 lb (84.8 kg)  11/24/18 175 lb 3.2 oz (79.5 kg)  07/22/16 179 lb (81.2 kg)   Temp Readings from Last 3 Encounters:  01/15/21 97.6 F (36.4 C)  10/22/19 98.1 F (36.7 C) (Oral)  04/04/18 98.9 F (37.2 C) (Oral)   BP Readings from Last 3 Encounters:  01/15/21 (!) 138/99  10/22/19 (!) 130/99  11/24/18 106/80   Pulse Readings from Last 3 Encounters:  01/15/21 (!) 105  10/22/19 75  11/24/18 97   Pain Assessment Pain Score: 0-No pain/10  In general this is  a well appearing African American male in no acute distress. He's alert and oriented x4 and appropriate throughout the examination. Cardiopulmonary assessment is negative for acute distress and he exhibits normal effort.     KPS = 100  100 - Normal; no complaints; no evidence of disease. 90   - Able to carry on normal activity; minor signs or symptoms of disease. 80   - Normal activity with effort; some signs or symptoms of disease. 84   - Cares for self; unable to carry on normal activity or to do active work. 60   - Requires occasional assistance, but is able to care for most of his personal needs. 50   - Requires  considerable assistance and frequent medical care. 33   - Disabled; requires special care and assistance. 62   - Severely disabled; hospital admission is indicated although death not imminent. 64   - Very sick; hospital admission necessary; active supportive treatment necessary. 10   - Moribund; fatal processes progressing rapidly. 0     - Dead  Karnofsky DA, Abelmann [place], Craver LS and Burchenal [facility] [phone]) The use of the nitrogen mustards in the palliative treatment of carcinoma: with particular reference to bronchogenic carcinoma Cancer 1 [phone]  LABORATORY DATA:  Lab Results  Component Value Date   WBC 6.9 04/04/2018   HGB 16.0 04/04/2018   HCT 47.0 04/04/2018   MCV 95.1 04/04/2018   PLT 182 04/04/2018   Lab Results  Component Value Date   NA 139 04/04/2018   K 6.3 (HH) 04/04/2018   CL 106 04/04/2018   CO2 26 04/04/2018   Lab Results  Component Value Date   ALT 16 (L) 04/04/2018   AST 24 04/04/2018   ALKPHOS 64 04/04/2018   BILITOT 0.8 04/04/2018     RADIOGRAPHY: No results found.    IMPRESSION/PLAN: 1. 65 y.o. male with biochemical recurrence of prostate cancer with PSA at 0.31 s/p RALP in 08/2008 for pT2c, Gleason 3+4, adenocarcinoma of the prostate. Today we reviewed the findings and workup thus far.  We discussed the natural history of prostate cancer.   We reviewed the the implications of positive margins, extracapsular extension, and seminal vesicle involvement on the risk of prostate cancer recurrence, none of which were present on his surgical pathology.   We discussed the SWOG trial which did show an improvement in disease-free survival as well as overall survival using adjuvant radiotherapy in the setting of adverse pathology. However, we discussed the fact that the study did not carefully control the usage of adjuvant radiotherapy in the observation arm. There is increasing evidence that careful surveillance with ultrasensitive PSA may provide an opportunity for early salvage in patients who undergo observation, which can lead to excellent results in terms of disease control and survival. We discussed a 7.5 week course of daily radiation treatment directed to the prostatic fossa and pelvic lymph nodes with regard to the logistics and delivery of external beam radiation treatment.  We reviewed data from the [facility] trial regarding use of ADT in combination with salvage XRT to the prostate fossa and pelvic lymph nodes in the setting of biochemical recurrence.  This study shows that extending radiation therapy to the pelvic lymph nodes combined with adding short-term hormone therapy to standard treatment of the prostate surgical bed can extend the amount of time before disease progression but does not appear to demonstrate an overall survival benefit.  Being well informed after a thorough discussion with Dr. Alinda Money regarding associated side effects, he elected to proceed with a 6 month Eligard injection on 01/14/21. He was encouraged to ask questions that were answered to his stated satisfaction.   At the end of our conversation, the patient would like to proceed with the recommended 7.5 week course of daily salvage external beam therapy in combination with ST-ADT. He received a 6 month Eligard injection yesterday, 01/14/21. We will share our discussion with Dr.  Alinda Money and move forward with treatment planning accordingly. He has freely signed written consent to proceed today in the office and a copy of this document will be placed in his medical record. He is tentatively scheduled for CT simulation on Friday, 01/18/21, at 9:30 am, in anticipation  of beginning his daily treatments in the near future. The patient appears to have a good understanding of his disease and our treatment recommendations which are of curative intent and is in agreement with the stated plan.  We enjoyed meting him and his wife today and look forward to continuing to participate in his care.    Nicholos Johns, PA-C    Tyler Pita, MD  [facility] Direct Dial: [phone]  Fax: [phone] [email or web address]  Skype  [email or web address]   This document serves as a record of services personally performed by Tyler Pita, MD and Freeman Caldron, PA-C. It was created on their behalf by Wilburn Mylar, a trained medical scribe. The creation of this record is based on the scribe's personal observations and the provider's statements to them. This document has been checked and approved by the attending provider.

## 2021-01-15 NOTE — Progress Notes (Signed)
Introduced myself to patient and his wife as the prostate nurse navigator and discussed my role. He had a prostatectomy in 2009 and now with a slowly rising PSA. He is here to discuss salvage radiation. We discussed his family history of cancer and meeting with a Dietitian. I gave them my business card and asked them to call me with questions or concerns.

## 2021-01-18 ENCOUNTER — Other Ambulatory Visit: Payer: Self-pay

## 2021-01-18 ENCOUNTER — Encounter: Payer: Self-pay | Admitting: Medical Oncology

## 2021-01-18 ENCOUNTER — Ambulatory Visit
Admission: RE | Admit: 2021-01-18 | Discharge: 2021-01-18 | Disposition: A | Discharge status: Home / Self Care | Payer: 59 | Source: Ambulatory Visit | Attending: Radiation Oncology | Admitting: Radiation Oncology

## 2021-01-18 DIAGNOSIS — C61 Malignant neoplasm of prostate: Secondary | ICD-10-CM | POA: Diagnosis not present

## 2021-01-18 DIAGNOSIS — Z510 Encounter for antineoplastic radiation therapy: Secondary | ICD-10-CM | POA: Insufficient documentation

## 2021-01-18 DIAGNOSIS — R9721 Rising PSA following treatment for malignant neoplasm of prostate: Secondary | ICD-10-CM

## 2021-01-20 NOTE — Progress Notes (Signed)
  Radiation Oncology         [phone] ________________________________  Name: Russell Jackson MRN: [id number]  Date: 01/18/2021  DOB: Nov 19, 1955  SIMULATION AND TREATMENT PLANNING NOTE    ICD-10-CM   1. Biochemically recurrent malignant neoplasm of prostate [facility])  C61    R97.21     DIAGNOSIS:  65 yo man with biochemical recurrence of prostate cancer with PSA at 0.31 s/p RALP in 08/2008 for pT2c, Gleason 3+4, adenocarcinoma of the prostate.  NARRATIVE:  The patient was brought to the [place].  Identity was confirmed.  All relevant records and images related to the planned course of therapy were reviewed.  The patient freely provided informed written consent to proceed with treatment after reviewing the details related to the planned course of therapy. The consent form was witnessed and verified by the simulation staff.  Then, the patient was set-up in a stable reproducible supine position for radiation therapy.  A vacuum lock pillow device was custom fabricated to position his legs in a reproducible immobilized position.  Then, I performed a urethrogram under sterile conditions to identify the prostatic bed.  CT images were obtained.  Surface markings were placed.  The CT images were loaded into the planning software.  Then the prostate bed target, pelvic lymph node target and avoidance structures including the rectum, bladder, bowel and hips were contoured.  Treatment planning then occurred.  The radiation prescription was entered and confirmed.  A total of one complex treatment devices were fabricated. I have requested : Intensity Modulated Radiotherapy (IMRT) is medically necessary for this case for the following reason:  Rectal sparing.Marland Kitchen  PLAN:  The patient will receive 45 Gy in 25 fractions of 1.8 Gy, followed by a boost to the prostate bed to a total dose of 68.4 Gy with 13 additional fractions of 1.8 Gy.   ________________________________  Sheral Apley Tammi Klippel, M.D.

## 2021-01-22 DIAGNOSIS — C61 Malignant neoplasm of prostate: Secondary | ICD-10-CM | POA: Diagnosis not present

## 2021-01-22 DIAGNOSIS — Z510 Encounter for antineoplastic radiation therapy: Secondary | ICD-10-CM | POA: Diagnosis not present

## 2021-01-24 ENCOUNTER — Other Ambulatory Visit: Payer: Self-pay

## 2021-01-24 ENCOUNTER — Ambulatory Visit
Admission: RE | Admit: 2021-01-24 | Discharge: 2021-01-24 | Disposition: A | Discharge status: Home / Self Care | Payer: 59 | Source: Ambulatory Visit | Attending: Radiation Oncology | Admitting: Radiation Oncology

## 2021-01-24 DIAGNOSIS — Z510 Encounter for antineoplastic radiation therapy: Secondary | ICD-10-CM | POA: Diagnosis not present

## 2021-01-24 DIAGNOSIS — C61 Malignant neoplasm of prostate: Secondary | ICD-10-CM | POA: Diagnosis not present

## 2021-01-25 ENCOUNTER — Ambulatory Visit
Admission: RE | Admit: 2021-01-25 | Discharge: 2021-01-25 | Disposition: A | Discharge status: Home / Self Care | Payer: 59 | Source: Ambulatory Visit | Attending: Radiation Oncology | Admitting: Radiation Oncology

## 2021-01-25 DIAGNOSIS — C61 Malignant neoplasm of prostate: Secondary | ICD-10-CM | POA: Diagnosis not present

## 2021-01-25 DIAGNOSIS — Z510 Encounter for antineoplastic radiation therapy: Secondary | ICD-10-CM | POA: Diagnosis not present

## 2021-01-28 ENCOUNTER — Ambulatory Visit
Admission: RE | Admit: 2021-01-28 | Discharge: 2021-01-28 | Disposition: A | Discharge status: Home / Self Care | Payer: 59 | Source: Ambulatory Visit | Attending: Radiation Oncology | Admitting: Radiation Oncology

## 2021-01-28 ENCOUNTER — Other Ambulatory Visit: Payer: Self-pay

## 2021-01-28 DIAGNOSIS — C61 Malignant neoplasm of prostate: Secondary | ICD-10-CM | POA: Diagnosis not present

## 2021-01-28 DIAGNOSIS — Z510 Encounter for antineoplastic radiation therapy: Secondary | ICD-10-CM | POA: Diagnosis not present

## 2021-01-29 ENCOUNTER — Ambulatory Visit: Payer: 59

## 2021-01-29 ENCOUNTER — Ambulatory Visit
Admission: RE | Admit: 2021-01-29 | Discharge: 2021-01-29 | Disposition: A | Discharge status: Home / Self Care | Payer: 59 | Source: Ambulatory Visit | Attending: Radiation Oncology | Admitting: Radiation Oncology

## 2021-01-29 DIAGNOSIS — C61 Malignant neoplasm of prostate: Secondary | ICD-10-CM | POA: Diagnosis not present

## 2021-01-29 DIAGNOSIS — Z510 Encounter for antineoplastic radiation therapy: Secondary | ICD-10-CM | POA: Diagnosis not present

## 2021-01-30 ENCOUNTER — Other Ambulatory Visit: Payer: Self-pay

## 2021-01-30 ENCOUNTER — Ambulatory Visit
Admission: RE | Admit: 2021-01-30 | Discharge: 2021-01-30 | Disposition: A | Discharge status: Home / Self Care | Payer: 59 | Source: Ambulatory Visit | Attending: Radiation Oncology | Admitting: Radiation Oncology

## 2021-01-30 DIAGNOSIS — Z510 Encounter for antineoplastic radiation therapy: Secondary | ICD-10-CM | POA: Diagnosis not present

## 2021-01-30 DIAGNOSIS — C61 Malignant neoplasm of prostate: Secondary | ICD-10-CM | POA: Diagnosis not present

## 2021-01-31 ENCOUNTER — Ambulatory Visit
Admission: RE | Admit: 2021-01-31 | Discharge: 2021-01-31 | Disposition: A | Discharge status: Home / Self Care | Payer: 59 | Source: Ambulatory Visit | Attending: Radiation Oncology | Admitting: Radiation Oncology

## 2021-01-31 ENCOUNTER — Other Ambulatory Visit: Payer: Self-pay

## 2021-01-31 DIAGNOSIS — Z510 Encounter for antineoplastic radiation therapy: Secondary | ICD-10-CM | POA: Diagnosis not present

## 2021-01-31 DIAGNOSIS — C61 Malignant neoplasm of prostate: Secondary | ICD-10-CM | POA: Diagnosis not present

## 2021-02-01 ENCOUNTER — Ambulatory Visit
Admission: RE | Admit: 2021-02-01 | Discharge: 2021-02-01 | Disposition: A | Discharge status: Home / Self Care | Payer: 59 | Source: Ambulatory Visit | Attending: Radiation Oncology | Admitting: Radiation Oncology

## 2021-02-01 DIAGNOSIS — Z510 Encounter for antineoplastic radiation therapy: Secondary | ICD-10-CM | POA: Diagnosis not present

## 2021-02-01 DIAGNOSIS — C61 Malignant neoplasm of prostate: Secondary | ICD-10-CM | POA: Diagnosis not present

## 2021-02-04 ENCOUNTER — Ambulatory Visit
Admission: RE | Admit: 2021-02-04 | Discharge: 2021-02-04 | Disposition: A | Discharge status: Home / Self Care | Payer: 59 | Source: Ambulatory Visit | Attending: Radiation Oncology | Admitting: Radiation Oncology

## 2021-02-04 ENCOUNTER — Other Ambulatory Visit: Payer: Self-pay

## 2021-02-04 DIAGNOSIS — Z510 Encounter for antineoplastic radiation therapy: Secondary | ICD-10-CM | POA: Diagnosis not present

## 2021-02-04 DIAGNOSIS — C61 Malignant neoplasm of prostate: Secondary | ICD-10-CM | POA: Diagnosis not present

## 2021-02-05 ENCOUNTER — Ambulatory Visit
Admission: RE | Admit: 2021-02-05 | Discharge: 2021-02-05 | Disposition: A | Discharge status: Home / Self Care | Payer: 59 | Source: Ambulatory Visit | Attending: Radiation Oncology | Admitting: Radiation Oncology

## 2021-02-05 DIAGNOSIS — C61 Malignant neoplasm of prostate: Secondary | ICD-10-CM | POA: Diagnosis not present

## 2021-02-05 DIAGNOSIS — Z510 Encounter for antineoplastic radiation therapy: Secondary | ICD-10-CM | POA: Diagnosis not present

## 2021-02-06 ENCOUNTER — Other Ambulatory Visit: Payer: Self-pay

## 2021-02-06 ENCOUNTER — Ambulatory Visit
Admission: RE | Admit: 2021-02-06 | Discharge: 2021-02-06 | Disposition: A | Discharge status: Home / Self Care | Payer: 59 | Source: Ambulatory Visit | Attending: Radiation Oncology | Admitting: Radiation Oncology

## 2021-02-06 DIAGNOSIS — Z510 Encounter for antineoplastic radiation therapy: Secondary | ICD-10-CM | POA: Diagnosis not present

## 2021-02-06 DIAGNOSIS — C61 Malignant neoplasm of prostate: Secondary | ICD-10-CM | POA: Diagnosis not present

## 2021-02-07 ENCOUNTER — Ambulatory Visit
Admission: RE | Admit: 2021-02-07 | Discharge: 2021-02-07 | Disposition: A | Discharge status: Home / Self Care | Payer: 59 | Source: Ambulatory Visit | Attending: Radiation Oncology | Admitting: Radiation Oncology

## 2021-02-07 DIAGNOSIS — C61 Malignant neoplasm of prostate: Secondary | ICD-10-CM | POA: Diagnosis not present

## 2021-02-07 DIAGNOSIS — Z510 Encounter for antineoplastic radiation therapy: Secondary | ICD-10-CM | POA: Diagnosis not present

## 2021-02-08 ENCOUNTER — Ambulatory Visit
Admission: RE | Admit: 2021-02-08 | Discharge: 2021-02-08 | Disposition: A | Discharge status: Home / Self Care | Payer: 59 | Source: Ambulatory Visit | Attending: Radiation Oncology | Admitting: Radiation Oncology

## 2021-02-08 ENCOUNTER — Other Ambulatory Visit: Payer: Self-pay

## 2021-02-08 DIAGNOSIS — Z510 Encounter for antineoplastic radiation therapy: Secondary | ICD-10-CM | POA: Diagnosis not present

## 2021-02-08 DIAGNOSIS — C61 Malignant neoplasm of prostate: Secondary | ICD-10-CM | POA: Diagnosis not present

## 2021-02-11 ENCOUNTER — Ambulatory Visit
Admission: RE | Admit: 2021-02-11 | Discharge: 2021-02-11 | Disposition: A | Discharge status: Home / Self Care | Payer: 59 | Source: Ambulatory Visit | Attending: Radiation Oncology | Admitting: Radiation Oncology

## 2021-02-11 ENCOUNTER — Other Ambulatory Visit: Payer: Self-pay

## 2021-02-11 DIAGNOSIS — C61 Malignant neoplasm of prostate: Secondary | ICD-10-CM | POA: Diagnosis not present

## 2021-02-11 DIAGNOSIS — Z510 Encounter for antineoplastic radiation therapy: Secondary | ICD-10-CM | POA: Diagnosis not present

## 2021-02-12 ENCOUNTER — Ambulatory Visit
Admission: RE | Admit: 2021-02-12 | Discharge: 2021-02-12 | Disposition: A | Discharge status: Home / Self Care | Payer: 59 | Source: Ambulatory Visit | Attending: Radiation Oncology | Admitting: Radiation Oncology

## 2021-02-12 DIAGNOSIS — Z510 Encounter for antineoplastic radiation therapy: Secondary | ICD-10-CM | POA: Diagnosis not present

## 2021-02-12 DIAGNOSIS — C61 Malignant neoplasm of prostate: Secondary | ICD-10-CM | POA: Diagnosis not present

## 2021-02-13 ENCOUNTER — Ambulatory Visit
Admission: RE | Admit: 2021-02-13 | Discharge: 2021-02-13 | Disposition: A | Discharge status: Home / Self Care | Payer: 59 | Source: Ambulatory Visit | Attending: Radiation Oncology | Admitting: Radiation Oncology

## 2021-02-13 DIAGNOSIS — Z510 Encounter for antineoplastic radiation therapy: Secondary | ICD-10-CM | POA: Diagnosis not present

## 2021-02-13 DIAGNOSIS — C61 Malignant neoplasm of prostate: Secondary | ICD-10-CM | POA: Diagnosis not present

## 2021-02-14 ENCOUNTER — Other Ambulatory Visit: Payer: Self-pay

## 2021-02-14 ENCOUNTER — Ambulatory Visit
Admission: RE | Admit: 2021-02-14 | Discharge: 2021-02-14 | Disposition: A | Discharge status: Home / Self Care | Payer: 59 | Source: Ambulatory Visit | Attending: Radiation Oncology | Admitting: Radiation Oncology

## 2021-02-14 DIAGNOSIS — Z510 Encounter for antineoplastic radiation therapy: Secondary | ICD-10-CM | POA: Diagnosis not present

## 2021-02-14 DIAGNOSIS — C61 Malignant neoplasm of prostate: Secondary | ICD-10-CM | POA: Diagnosis not present

## 2021-02-15 ENCOUNTER — Ambulatory Visit
Admission: RE | Admit: 2021-02-15 | Discharge: 2021-02-15 | Disposition: A | Discharge status: Home / Self Care | Payer: 59 | Source: Ambulatory Visit | Attending: Radiation Oncology | Admitting: Radiation Oncology

## 2021-02-15 DIAGNOSIS — C61 Malignant neoplasm of prostate: Secondary | ICD-10-CM | POA: Diagnosis not present

## 2021-02-15 DIAGNOSIS — Z510 Encounter for antineoplastic radiation therapy: Secondary | ICD-10-CM | POA: Diagnosis not present

## 2021-02-18 ENCOUNTER — Ambulatory Visit
Admission: RE | Admit: 2021-02-18 | Discharge: 2021-02-18 | Disposition: A | Discharge status: Home / Self Care | Payer: 59 | Source: Ambulatory Visit | Attending: Radiation Oncology | Admitting: Radiation Oncology

## 2021-02-18 ENCOUNTER — Other Ambulatory Visit: Payer: Self-pay

## 2021-02-18 DIAGNOSIS — Z510 Encounter for antineoplastic radiation therapy: Secondary | ICD-10-CM | POA: Insufficient documentation

## 2021-02-18 DIAGNOSIS — C61 Malignant neoplasm of prostate: Secondary | ICD-10-CM | POA: Insufficient documentation

## 2021-02-19 ENCOUNTER — Ambulatory Visit
Admission: RE | Admit: 2021-02-19 | Discharge: 2021-02-19 | Disposition: A | Discharge status: Home / Self Care | Payer: 59 | Source: Ambulatory Visit | Attending: Radiation Oncology | Admitting: Radiation Oncology

## 2021-02-19 DIAGNOSIS — Z510 Encounter for antineoplastic radiation therapy: Secondary | ICD-10-CM | POA: Diagnosis not present

## 2021-02-19 DIAGNOSIS — C61 Malignant neoplasm of prostate: Secondary | ICD-10-CM | POA: Diagnosis not present

## 2021-02-20 ENCOUNTER — Ambulatory Visit
Admission: RE | Admit: 2021-02-20 | Discharge: 2021-02-20 | Disposition: A | Discharge status: Home / Self Care | Payer: 59 | Source: Ambulatory Visit | Attending: Radiation Oncology | Admitting: Radiation Oncology

## 2021-02-20 ENCOUNTER — Other Ambulatory Visit: Payer: Self-pay

## 2021-02-20 DIAGNOSIS — Z510 Encounter for antineoplastic radiation therapy: Secondary | ICD-10-CM | POA: Diagnosis not present

## 2021-02-20 DIAGNOSIS — C61 Malignant neoplasm of prostate: Secondary | ICD-10-CM | POA: Diagnosis not present

## 2021-02-21 ENCOUNTER — Ambulatory Visit
Admission: RE | Admit: 2021-02-21 | Discharge: 2021-02-21 | Disposition: A | Discharge status: Home / Self Care | Payer: 59 | Source: Ambulatory Visit | Attending: Radiation Oncology | Admitting: Radiation Oncology

## 2021-02-21 DIAGNOSIS — C61 Malignant neoplasm of prostate: Secondary | ICD-10-CM | POA: Diagnosis not present

## 2021-02-21 DIAGNOSIS — Z510 Encounter for antineoplastic radiation therapy: Secondary | ICD-10-CM | POA: Diagnosis not present

## 2021-02-22 ENCOUNTER — Ambulatory Visit
Admission: RE | Admit: 2021-02-22 | Discharge: 2021-02-22 | Disposition: A | Discharge status: Home / Self Care | Payer: 59 | Source: Ambulatory Visit | Attending: Radiation Oncology | Admitting: Radiation Oncology

## 2021-02-22 ENCOUNTER — Other Ambulatory Visit: Payer: Self-pay

## 2021-02-22 DIAGNOSIS — C61 Malignant neoplasm of prostate: Secondary | ICD-10-CM | POA: Diagnosis not present

## 2021-02-22 DIAGNOSIS — Z510 Encounter for antineoplastic radiation therapy: Secondary | ICD-10-CM | POA: Diagnosis not present

## 2021-02-25 ENCOUNTER — Ambulatory Visit
Admission: RE | Admit: 2021-02-25 | Discharge: 2021-02-25 | Disposition: A | Discharge status: Home / Self Care | Payer: 59 | Source: Ambulatory Visit | Attending: Radiation Oncology | Admitting: Radiation Oncology

## 2021-02-25 ENCOUNTER — Other Ambulatory Visit: Payer: Self-pay

## 2021-02-25 DIAGNOSIS — C61 Malignant neoplasm of prostate: Secondary | ICD-10-CM | POA: Diagnosis not present

## 2021-02-25 DIAGNOSIS — Z510 Encounter for antineoplastic radiation therapy: Secondary | ICD-10-CM | POA: Diagnosis not present

## 2021-02-26 ENCOUNTER — Ambulatory Visit
Admission: RE | Admit: 2021-02-26 | Discharge: 2021-02-26 | Disposition: A | Discharge status: Home / Self Care | Payer: 59 | Source: Ambulatory Visit | Attending: Radiation Oncology | Admitting: Radiation Oncology

## 2021-02-26 DIAGNOSIS — C61 Malignant neoplasm of prostate: Secondary | ICD-10-CM | POA: Diagnosis not present

## 2021-02-26 DIAGNOSIS — Z510 Encounter for antineoplastic radiation therapy: Secondary | ICD-10-CM | POA: Diagnosis not present

## 2021-02-27 ENCOUNTER — Other Ambulatory Visit: Payer: Self-pay

## 2021-02-27 ENCOUNTER — Ambulatory Visit
Admission: RE | Admit: 2021-02-27 | Discharge: 2021-02-27 | Disposition: A | Discharge status: Home / Self Care | Payer: 59 | Source: Ambulatory Visit | Attending: Radiation Oncology | Admitting: Radiation Oncology

## 2021-02-27 DIAGNOSIS — C61 Malignant neoplasm of prostate: Secondary | ICD-10-CM | POA: Diagnosis not present

## 2021-02-27 DIAGNOSIS — Z510 Encounter for antineoplastic radiation therapy: Secondary | ICD-10-CM | POA: Diagnosis not present

## 2021-02-28 ENCOUNTER — Ambulatory Visit
Admission: RE | Admit: 2021-02-28 | Discharge: 2021-02-28 | Disposition: A | Discharge status: Home / Self Care | Payer: 59 | Source: Ambulatory Visit | Attending: Radiation Oncology | Admitting: Radiation Oncology

## 2021-02-28 DIAGNOSIS — Z510 Encounter for antineoplastic radiation therapy: Secondary | ICD-10-CM | POA: Diagnosis not present

## 2021-02-28 DIAGNOSIS — C61 Malignant neoplasm of prostate: Secondary | ICD-10-CM | POA: Diagnosis not present

## 2021-03-01 ENCOUNTER — Ambulatory Visit
Admission: RE | Admit: 2021-03-01 | Discharge: 2021-03-01 | Disposition: A | Discharge status: Home / Self Care | Payer: 59 | Source: Ambulatory Visit | Attending: Radiation Oncology | Admitting: Radiation Oncology

## 2021-03-01 ENCOUNTER — Other Ambulatory Visit: Payer: Self-pay

## 2021-03-01 DIAGNOSIS — C61 Malignant neoplasm of prostate: Secondary | ICD-10-CM | POA: Diagnosis not present

## 2021-03-01 DIAGNOSIS — Z510 Encounter for antineoplastic radiation therapy: Secondary | ICD-10-CM | POA: Diagnosis not present

## 2021-03-04 ENCOUNTER — Ambulatory Visit
Admission: RE | Admit: 2021-03-04 | Discharge: 2021-03-04 | Disposition: A | Discharge status: Home / Self Care | Payer: 59 | Source: Ambulatory Visit | Attending: Radiation Oncology | Admitting: Radiation Oncology

## 2021-03-04 DIAGNOSIS — Z510 Encounter for antineoplastic radiation therapy: Secondary | ICD-10-CM | POA: Diagnosis not present

## 2021-03-04 DIAGNOSIS — C61 Malignant neoplasm of prostate: Secondary | ICD-10-CM | POA: Diagnosis not present

## 2021-03-05 ENCOUNTER — Ambulatory Visit
Admission: RE | Admit: 2021-03-05 | Discharge: 2021-03-05 | Disposition: A | Discharge status: Home / Self Care | Payer: 59 | Source: Ambulatory Visit | Attending: Radiation Oncology | Admitting: Radiation Oncology

## 2021-03-05 DIAGNOSIS — C61 Malignant neoplasm of prostate: Secondary | ICD-10-CM | POA: Diagnosis not present

## 2021-03-05 DIAGNOSIS — Z510 Encounter for antineoplastic radiation therapy: Secondary | ICD-10-CM | POA: Diagnosis not present

## 2021-03-06 ENCOUNTER — Other Ambulatory Visit: Payer: Self-pay

## 2021-03-06 ENCOUNTER — Ambulatory Visit
Admission: RE | Admit: 2021-03-06 | Discharge: 2021-03-06 | Disposition: A | Discharge status: Home / Self Care | Payer: 59 | Source: Ambulatory Visit | Attending: Radiation Oncology | Admitting: Radiation Oncology

## 2021-03-06 DIAGNOSIS — C61 Malignant neoplasm of prostate: Secondary | ICD-10-CM | POA: Diagnosis not present

## 2021-03-06 DIAGNOSIS — Z510 Encounter for antineoplastic radiation therapy: Secondary | ICD-10-CM | POA: Diagnosis not present

## 2021-03-07 ENCOUNTER — Ambulatory Visit
Admission: RE | Admit: 2021-03-07 | Discharge: 2021-03-07 | Disposition: A | Discharge status: Home / Self Care | Payer: 59 | Source: Ambulatory Visit | Attending: Radiation Oncology | Admitting: Radiation Oncology

## 2021-03-07 DIAGNOSIS — Z510 Encounter for antineoplastic radiation therapy: Secondary | ICD-10-CM | POA: Diagnosis not present

## 2021-03-07 DIAGNOSIS — C61 Malignant neoplasm of prostate: Secondary | ICD-10-CM | POA: Diagnosis not present

## 2021-03-08 ENCOUNTER — Ambulatory Visit
Admission: RE | Admit: 2021-03-08 | Discharge: 2021-03-08 | Disposition: A | Discharge status: Home / Self Care | Payer: 59 | Source: Ambulatory Visit | Attending: Radiation Oncology | Admitting: Radiation Oncology

## 2021-03-08 ENCOUNTER — Other Ambulatory Visit: Payer: Self-pay

## 2021-03-08 DIAGNOSIS — Z510 Encounter for antineoplastic radiation therapy: Secondary | ICD-10-CM | POA: Diagnosis not present

## 2021-03-08 DIAGNOSIS — C61 Malignant neoplasm of prostate: Secondary | ICD-10-CM | POA: Diagnosis not present

## 2021-03-11 ENCOUNTER — Ambulatory Visit
Admission: RE | Admit: 2021-03-11 | Discharge: 2021-03-11 | Disposition: A | Discharge status: Home / Self Care | Payer: 59 | Source: Ambulatory Visit | Attending: Radiation Oncology | Admitting: Radiation Oncology

## 2021-03-11 ENCOUNTER — Other Ambulatory Visit: Payer: Self-pay

## 2021-03-11 DIAGNOSIS — Z510 Encounter for antineoplastic radiation therapy: Secondary | ICD-10-CM | POA: Diagnosis not present

## 2021-03-11 DIAGNOSIS — C61 Malignant neoplasm of prostate: Secondary | ICD-10-CM | POA: Diagnosis not present

## 2021-03-12 ENCOUNTER — Ambulatory Visit
Admission: RE | Admit: 2021-03-12 | Discharge: 2021-03-12 | Disposition: A | Discharge status: Home / Self Care | Payer: 59 | Source: Ambulatory Visit | Attending: Radiation Oncology | Admitting: Radiation Oncology

## 2021-03-12 DIAGNOSIS — Z510 Encounter for antineoplastic radiation therapy: Secondary | ICD-10-CM | POA: Diagnosis not present

## 2021-03-12 DIAGNOSIS — C61 Malignant neoplasm of prostate: Secondary | ICD-10-CM | POA: Diagnosis not present

## 2021-03-13 ENCOUNTER — Ambulatory Visit
Admission: RE | Admit: 2021-03-13 | Discharge: 2021-03-13 | Disposition: A | Discharge status: Home / Self Care | Payer: 59 | Source: Ambulatory Visit | Attending: Radiation Oncology | Admitting: Radiation Oncology

## 2021-03-13 ENCOUNTER — Other Ambulatory Visit: Payer: Self-pay

## 2021-03-13 DIAGNOSIS — Z510 Encounter for antineoplastic radiation therapy: Secondary | ICD-10-CM | POA: Diagnosis not present

## 2021-03-13 DIAGNOSIS — C61 Malignant neoplasm of prostate: Secondary | ICD-10-CM | POA: Diagnosis not present

## 2021-03-14 ENCOUNTER — Ambulatory Visit
Admission: RE | Admit: 2021-03-14 | Discharge: 2021-03-14 | Disposition: A | Discharge status: Home / Self Care | Payer: 59 | Source: Ambulatory Visit | Attending: Radiation Oncology | Admitting: Radiation Oncology

## 2021-03-14 DIAGNOSIS — Z510 Encounter for antineoplastic radiation therapy: Secondary | ICD-10-CM | POA: Diagnosis not present

## 2021-03-14 DIAGNOSIS — C61 Malignant neoplasm of prostate: Secondary | ICD-10-CM | POA: Diagnosis not present

## 2021-03-15 ENCOUNTER — Ambulatory Visit
Admission: RE | Admit: 2021-03-15 | Discharge: 2021-03-15 | Disposition: A | Discharge status: Home / Self Care | Payer: 59 | Source: Ambulatory Visit | Attending: Radiation Oncology | Admitting: Radiation Oncology

## 2021-03-15 DIAGNOSIS — C61 Malignant neoplasm of prostate: Secondary | ICD-10-CM | POA: Diagnosis not present

## 2021-03-15 DIAGNOSIS — Z510 Encounter for antineoplastic radiation therapy: Secondary | ICD-10-CM | POA: Diagnosis not present

## 2021-03-19 ENCOUNTER — Other Ambulatory Visit: Payer: Self-pay

## 2021-03-19 ENCOUNTER — Ambulatory Visit
Admission: RE | Admit: 2021-03-19 | Discharge: 2021-03-19 | Disposition: A | Discharge status: Home / Self Care | Payer: 59 | Source: Ambulatory Visit | Attending: Radiation Oncology | Admitting: Radiation Oncology

## 2021-03-19 ENCOUNTER — Encounter: Payer: Self-pay | Admitting: Urology

## 2021-03-19 DIAGNOSIS — Z510 Encounter for antineoplastic radiation therapy: Secondary | ICD-10-CM | POA: Diagnosis not present

## 2021-03-19 DIAGNOSIS — C61 Malignant neoplasm of prostate: Secondary | ICD-10-CM | POA: Diagnosis not present

## 2021-03-20 ENCOUNTER — Ambulatory Visit: Payer: 59

## 2021-03-21 ENCOUNTER — Ambulatory Visit: Payer: 59

## 2021-03-22 ENCOUNTER — Ambulatory Visit: Payer: 59

## 2021-05-07 NOTE — Progress Notes (Signed)
  Radiation Oncology         [phone] ________________________________  Name: Russell Jackson MRN: [id number]  Date: 03/19/2021  DOB: Feb 02, 1956  End of Treatment Note  Diagnosis:   65 yo man with biochemical recurrence of prostate cancer with PSA at 0.31 s/p RALP in 08/2008 for pT2c, Gleason 3+4, adenocarcinoma of the prostate.     Indication for treatment:  Curative, Definitive Radiotherapy       Radiation treatment dates:   01/24/21 - 03/19/21  Site/dose:  1. The prostate fossa and pelvic lymph nodes were initially treated to 45 Gy in 25 fractions of 1.8 Gy  2. The prostate fossa only was boosted to 68.4 Gy with 13 additional fractions of 1.8 Gy   Beams/energy:  1. The prostate fossa  and pelvic lymph nodes were initially treated using VMAT intensity modulated radiotherapy delivering 6 megavolt photons. Image guidance was performed with CB-CT studies prior to each fraction. He was immobilized with a body fix lower extremity mold.  2. The prostate fossa only was boosted using VMAT intensity modulated radiotherapy delivering 6 megavolt photons. Image guidance was performed with CB-CT studies prior to each fraction. He was immobilized with a body fix lower extremity mold.  Narrative: The patient tolerated radiation treatment relatively well with only minor urinary irritation with nocturia x3-4 and modest fatigue.  He denied gross hematuria, dysuria, excessive frequency, urgency or incomplete emptying.  Plan: The patient has completed radiation treatment. He will return to radiation oncology clinic for routine followup in one month. I advised him to call or return sooner if he has any questions or concerns related to his recovery or treatment. ________________________________  Sheral Apley. Tammi Klippel, M.D.

## 2021-05-07 NOTE — Progress Notes (Signed)
Radiation Oncology         [phone] ________________________________  Name: Nalu Troublefield MRN: [id number]  Date: 05/08/2021  DOB: April 04, 1956  Post Treatment Note  CC: Wenda Low, MD  Raynelle Bring, MD  Diagnosis:   65 yo man with biochemical recurrence of prostate cancer with PSA at 0.31 s/p RALP in 08/2008 for pT2c, Gleason 3+4, adenocarcinoma of the prostate.  Interval Since Last Radiation:  6.5 weeks  01/24/21 - 03/19/21: 1. The prostate fossa and pelvic lymph nodes were initially treated to 45 Gy in 25 fractions of 1.8 Gy 2. The prostate fossa only was boosted to 68.4 Gy with 13 additional fractions of 1.8 Gy  Narrative:  I spoke with the patient to conduct his routine scheduled 1 month follow up visit via telephone to spare the patient unnecessary potential exposure in the healthcare setting during the current COVID-19 pandemic.  The patient was notified in advance and gave permission to proceed with this visit format. He tolerated radiation treatment relatively well with only minor urinary irritation with nocturia x3-4 and modest fatigue.  He denied gross hematuria, dysuria, excessive frequency, urgency or incomplete emptying.                              On review of systems, the patient states that he is doing well in general.  His current IPSS score is 3 and he feels that he is currently back to his baseline regarding LUTS.  He specifically denies dysuria, gross hematuria, excessive daytime frequency, urgency, straining to void, incomplete bladder emptying or incontinence.  He is having some occasional hot flashes and a little decreased stamina associated with his ADT but really feels like he is tolerating this well in general.  He reports a healthy appetite and is maintaining his weight.  He had 1 episode of constipation approximately 2 weeks after completing his radiation treatments but this has since resolved and he currently denies any abdominal pain, nausea, vomiting, diarrhea  or constipation.  Overall, he is quite pleased with his progress to date.  ALLERGIES:  is allergic to naproxen sodium.  Meds: Current Outpatient Medications  Medication Sig Dispense Refill   ASPIRIN LOW DOSE 81 MG EC tablet Take 1 tablet by mouth daily.     cetirizine (ZYRTEC) 10 MG tablet Take by mouth.     cholecalciferol (VITAMIN D3) 25 MCG (1000 UNIT) tablet 1 tablet     fluticasone (FLONASE) 50 MCG/ACT nasal spray      lisinopril (PRINIVIL,ZESTRIL) 10 MG tablet Take 1 tablet by mouth daily.     lisinopril (ZESTRIL) 10 MG tablet TAKE 1/2 TABLET BY MOUTH ONCE DAILY 45 tablet 4   nitroGLYCERIN (NITROSTAT) 0.4 MG SL tablet Place 1 tablet under the tongue as needed. (Patient not taking: Reported on 01/15/2021)     omeprazole (PRILOSEC OTC) 20 MG tablet 1 tablet     No current facility-administered medications for this visit.    Physical Findings:  vitals were not taken for this visit.   /10 Unable to assess due to telephone follow up visit format.  Lab Findings: Lab Results  Component Value Date   WBC 6.9 04/04/2018   HGB 16.0 04/04/2018   HCT 47.0 04/04/2018   MCV 95.1 04/04/2018   PLT 182 04/04/2018     Radiographic Findings: No results found.  Impression/Plan: 48. 65 yo man with biochemical recurrence of prostate cancer with PSA at 0.31 s/p RALP in 08/2008  for pT2c, Gleason 3+4, adenocarcinoma of the prostate. He will continue to follow up with urology for ongoing PSA determinations and has an appointment scheduled for labs 08/30/21 and will see Dr. Alinda Money the following week, unless Dr. Alinda Money prefers that he be seen sooner. He understands what to expect with regards to PSA monitoring going forward. I will look forward to following his response to treatment via correspondence with urology, and would be happy to continue to participate in his care if clinically indicated. I talked to the patient about what to expect in the future, including his risk for erectile dysfunction and  rectal bleeding. I encouraged him to call or return to the office if he has any questions regarding his previous radiation or possible radiation side effects. He was comfortable with this plan and will follow up as needed.     Nicholos Johns, PA-C

## 2021-05-08 ENCOUNTER — Other Ambulatory Visit: Payer: Self-pay

## 2021-05-08 ENCOUNTER — Encounter: Payer: Self-pay | Admitting: Urology

## 2021-05-08 ENCOUNTER — Ambulatory Visit
Admission: RE | Admit: 2021-05-08 | Discharge: 2021-05-08 | Disposition: A | Discharge status: Home / Self Care | Payer: 59 | Source: Ambulatory Visit | Attending: Urology | Admitting: Urology

## 2021-05-08 VITALS — BP 133/94 | HR 83 | Temp 97.5°F | Resp 20 | Wt 184.6 lb

## 2021-05-08 DIAGNOSIS — R9721 Rising PSA following treatment for malignant neoplasm of prostate: Secondary | ICD-10-CM

## 2021-05-08 NOTE — Progress Notes (Addendum)
Patient presents today for follow up of prostate cancer. He denies pain. Appetite is great.  He states about a week after treatment he began to have what felt like sinus headaches occasionally. These would last briefly then resolve. He states his strength is coming back. IPPS score is 3. Patient states he currently doesn't have an appointment scheduled with his urologist. He feels they are waiting until after today's visit.   Vitals:   05/08/21 0900  BP: (!) 133/94  Pulse: 83  Resp: 20  Temp: (!) 97.5 F (36.4 C)  SpO2: 99%  Weight: 184 lb 9.6 oz (83.7 kg)

## 2021-06-11 DIAGNOSIS — N400 Enlarged prostate without lower urinary tract symptoms: Secondary | ICD-10-CM | POA: Diagnosis not present

## 2021-06-11 DIAGNOSIS — F419 Anxiety disorder, unspecified: Secondary | ICD-10-CM | POA: Diagnosis not present

## 2021-06-11 DIAGNOSIS — E7800 Pure hypercholesterolemia, unspecified: Secondary | ICD-10-CM | POA: Diagnosis not present

## 2021-06-11 DIAGNOSIS — I10 Essential (primary) hypertension: Secondary | ICD-10-CM | POA: Diagnosis not present

## 2021-06-11 DIAGNOSIS — M519 Unspecified thoracic, thoracolumbar and lumbosacral intervertebral disc disorder: Secondary | ICD-10-CM | POA: Diagnosis not present

## 2021-06-11 DIAGNOSIS — Z23 Encounter for immunization: Secondary | ICD-10-CM | POA: Diagnosis not present

## 2021-06-11 DIAGNOSIS — Z1389 Encounter for screening for other disorder: Secondary | ICD-10-CM | POA: Diagnosis not present

## 2021-06-11 DIAGNOSIS — J309 Allergic rhinitis, unspecified: Secondary | ICD-10-CM | POA: Diagnosis not present

## 2021-06-11 DIAGNOSIS — E559 Vitamin D deficiency, unspecified: Secondary | ICD-10-CM | POA: Diagnosis not present

## 2021-06-11 DIAGNOSIS — C61 Malignant neoplasm of prostate: Secondary | ICD-10-CM | POA: Diagnosis not present

## 2021-06-11 DIAGNOSIS — Z8546 Personal history of malignant neoplasm of prostate: Secondary | ICD-10-CM | POA: Diagnosis not present

## 2021-06-11 DIAGNOSIS — Z0000 Encounter for general adult medical examination without abnormal findings: Secondary | ICD-10-CM | POA: Diagnosis not present

## 2021-08-30 DIAGNOSIS — N5201 Erectile dysfunction due to arterial insufficiency: Secondary | ICD-10-CM | POA: Diagnosis not present

## 2021-08-30 DIAGNOSIS — C61 Malignant neoplasm of prostate: Secondary | ICD-10-CM | POA: Diagnosis not present

## 2021-09-06 DIAGNOSIS — N5201 Erectile dysfunction due to arterial insufficiency: Secondary | ICD-10-CM | POA: Diagnosis not present

## 2021-09-06 DIAGNOSIS — C61 Malignant neoplasm of prostate: Secondary | ICD-10-CM | POA: Diagnosis not present

## 2021-12-11 ENCOUNTER — Other Ambulatory Visit (HOSPITAL_COMMUNITY): Payer: Self-pay

## 2021-12-11 DIAGNOSIS — Z8546 Personal history of malignant neoplasm of prostate: Secondary | ICD-10-CM | POA: Diagnosis not present

## 2021-12-11 DIAGNOSIS — I10 Essential (primary) hypertension: Secondary | ICD-10-CM | POA: Diagnosis not present

## 2021-12-11 DIAGNOSIS — N1831 Chronic kidney disease, stage 3a: Secondary | ICD-10-CM | POA: Diagnosis not present

## 2021-12-11 DIAGNOSIS — R509 Fever, unspecified: Secondary | ICD-10-CM | POA: Diagnosis not present

## 2021-12-11 DIAGNOSIS — U071 COVID-19: Secondary | ICD-10-CM | POA: Diagnosis not present

## 2021-12-11 DIAGNOSIS — J309 Allergic rhinitis, unspecified: Secondary | ICD-10-CM | POA: Diagnosis not present

## 2021-12-11 DIAGNOSIS — E7800 Pure hypercholesterolemia, unspecified: Secondary | ICD-10-CM | POA: Diagnosis not present

## 2021-12-11 MED ORDER — PAXLOVID (150/100) 10 X 150 MG & 10 X 100MG PO TBPK
ORAL_TABLET | ORAL | 0 refills | Status: AC
  Filled 2021-12-11 (×2): qty 20, 5d supply, fill #0

## 2021-12-15 ENCOUNTER — Emergency Department (HOSPITAL_BASED_OUTPATIENT_CLINIC_OR_DEPARTMENT_OTHER)
Admission: EM | Admit: 2021-12-15 | Discharge: 2021-12-16 | Disposition: A | Discharge status: Home / Self Care | Payer: 59 | Attending: Emergency Medicine | Admitting: Emergency Medicine

## 2021-12-15 ENCOUNTER — Emergency Department (HOSPITAL_BASED_OUTPATIENT_CLINIC_OR_DEPARTMENT_OTHER): Payer: 59

## 2021-12-15 ENCOUNTER — Other Ambulatory Visit: Payer: Self-pay

## 2021-12-15 ENCOUNTER — Encounter (HOSPITAL_BASED_OUTPATIENT_CLINIC_OR_DEPARTMENT_OTHER): Payer: Self-pay

## 2021-12-15 DIAGNOSIS — Z8546 Personal history of malignant neoplasm of prostate: Secondary | ICD-10-CM | POA: Insufficient documentation

## 2021-12-15 DIAGNOSIS — Z7982 Long term (current) use of aspirin: Secondary | ICD-10-CM | POA: Insufficient documentation

## 2021-12-15 DIAGNOSIS — U071 COVID-19: Secondary | ICD-10-CM

## 2021-12-15 DIAGNOSIS — I10 Essential (primary) hypertension: Secondary | ICD-10-CM | POA: Diagnosis not present

## 2021-12-15 DIAGNOSIS — R42 Dizziness and giddiness: Secondary | ICD-10-CM | POA: Diagnosis not present

## 2021-12-15 DIAGNOSIS — Z8616 Personal history of COVID-19: Secondary | ICD-10-CM | POA: Diagnosis not present

## 2021-12-15 LAB — CBC
HCT: 47.7 % (ref 39.0–52.0)
Hemoglobin: 15.6 g/dL (ref 13.0–17.0)
MCH: 30.4 pg (ref 26.0–34.0)
MCHC: 32.7 g/dL (ref 30.0–36.0)
MCV: 93 fL (ref 80.0–100.0)
Platelets: 180 10*3/uL (ref 150–400)
RBC: 5.13 MIL/uL (ref 4.22–5.81)
RDW: 13 % (ref 11.5–15.5)
WBC: 4.6 10*3/uL (ref 4.0–10.5)
nRBC: 0 % (ref 0.0–0.2)

## 2021-12-15 LAB — URINALYSIS, ROUTINE W REFLEX MICROSCOPIC
Bilirubin Urine: NEGATIVE
Glucose, UA: NEGATIVE mg/dL
Hgb urine dipstick: NEGATIVE
Ketones, ur: NEGATIVE mg/dL
Leukocytes,Ua: NEGATIVE
Nitrite: NEGATIVE
Protein, ur: NEGATIVE mg/dL
Specific Gravity, Urine: 1.017 (ref 1.005–1.030)
pH: 5 (ref 5.0–8.0)

## 2021-12-15 LAB — BASIC METABOLIC PANEL
Anion gap: 9 (ref 5–15)
BUN: 13 mg/dL (ref 8–23)
CO2: 23 mmol/L (ref 22–32)
Calcium: 9.3 mg/dL (ref 8.9–10.3)
Chloride: 104 mmol/L (ref 98–111)
Creatinine, Ser: 1.15 mg/dL (ref 0.61–1.24)
GFR, Estimated: >60 mL/min (ref >60)
Glucose, Bld: 111 mg/dL — ABNORMAL HIGH (ref 70–99)
Potassium: 4.1 mmol/L (ref 3.5–5.1)
Sodium: 136 mmol/L (ref 135–145)

## 2021-12-15 LAB — CBG MONITORING, ED: Glucose-Capillary: 112 mg/dL — ABNORMAL HIGH (ref 70–99)

## 2021-12-15 MED ORDER — SODIUM CHLORIDE 0.9 % IV BOLUS
1000.0000 mL | Freq: Once | INTRAVENOUS | Status: AC
  Administered 2021-12-15: 1000 mL via INTRAVENOUS

## 2021-12-15 MED ORDER — MECLIZINE HCL 25 MG PO TABS
25.0000 mg | ORAL_TABLET | Freq: Once | ORAL | Status: AC
  Administered 2021-12-15: 25 mg via ORAL
  Filled 2021-12-15: qty 1

## 2021-12-15 NOTE — ED Triage Notes (Addendum)
Pt reports dizzines x 1 day Dx with Covid on Tuesday and is on Paxlovid BP at home was 154/109 Pt has not been on BP meds in over a year

## 2021-12-15 NOTE — ED Provider Notes (Signed)
[facility] EMERGENCY DEPT Provider Note   CSN: [id number] Arrival date & time: 12/15/21  2114     History  Chief Complaint  Patient presents with   Dizziness    Russell Jackson is a 66 y.o. male.  Patient is a 65 year old male with past medical history of hypertension, prostate cancer, hyperlipidemia.  Patient presenting today for evaluation of dizziness.  He reports feeling off balance and "out of it" for the past 4 or 5 days.  He was diagnosed earlier in the week with COVID-19 and was started on paxlovid.  He denies to me he is having any headaches, chest pain, difficulty breathing, palpitations.  Symptoms seem to be worse when he is ambulating  The history is provided by the patient.  Dizziness Quality:  Imbalance Severity:  Moderate Onset quality:  Gradual Duration:  5 days Timing:  Intermittent Progression:  Worsening Chronicity:  New Relieved by:  Nothing Worsened by:  Nothing     Home Medications Prior to Admission medications   Medication Sig Start Date End Date Taking? Authorizing Provider  ASPIRIN LOW DOSE 81 MG EC tablet Take 1 tablet by mouth daily. Patient not taking: Reported on 05/08/2021 11/17/18   [provider]  cetirizine (ZYRTEC) 10 MG tablet Take by mouth. Patient not taking: Reported on 05/08/2021    [provider]  cholecalciferol (VITAMIN D3) 25 MCG (1000 UNIT) tablet 1 tablet 05/13/13   [provider]  fluticasone Asencion Islam) 50 MCG/ACT nasal spray  01/19/19   [provider]  lisinopril (PRINIVIL,ZESTRIL) 10 MG tablet Take 1 tablet by mouth daily. Patient not taking: Reported on 05/08/2021 11/17/18   [provider]  lisinopril (ZESTRIL) 10 MG tablet TAKE 1/2 TABLET BY MOUTH ONCE DAILY Patient not taking: Reported on 05/08/2021 09/10/20 09/10/21  Wenda Low, MD  nirmatrelvir & ritonavir (PAXLOVID, 150/100,) 10 x 150 MG & 10 x 100MG  TBPK Take 2 tablets by mouth twice daily for 5 days. 12/11/21      nitroGLYCERIN (NITROSTAT) 0.4 MG SL tablet Place 1 tablet under the tongue as needed. Patient not taking: No sig reported 11/17/18   [provider]  omeprazole (PRILOSEC OTC) 20 MG tablet 1 tablet    [provider]      Allergies    Naproxen sodium    Review of Systems   Review of Systems  Neurological:  Positive for dizziness.  All other systems reviewed and are negative.  Physical Exam Updated Vital Signs BP (!) 119/91    Pulse 93    Temp 98 F (36.7 C) (Oral)    Resp 17    Ht 5\' 7"  (1.702 m)    Wt 86.6 kg    SpO2 100%    BMI 29.91 kg/m  Physical Exam Vitals and nursing note reviewed.  Constitutional:      General: He is not in acute distress.    Appearance: He is well-developed. He is not diaphoretic.  HENT:     Head: Normocephalic and atraumatic.     Mouth/Throat:     Mouth: Mucous membranes are moist.     Pharynx: No oropharyngeal exudate or posterior oropharyngeal erythema.  Eyes:     Extraocular Movements: Extraocular movements intact.     Pupils: Pupils are equal, round, and reactive to light.  Cardiovascular:     Rate and Rhythm: Normal rate and regular rhythm.     Heart sounds: No murmur heard.   No friction rub.  Pulmonary:     Effort:  Pulmonary effort is normal. No respiratory distress.     Breath sounds: Normal breath sounds. No wheezing or rales.  Abdominal:     General: Bowel sounds are normal. There is no distension.     Palpations: Abdomen is soft.     Tenderness: There is no abdominal tenderness.  Musculoskeletal:        General: Normal range of motion.     Cervical back: Normal range of motion and neck supple.  Skin:    General: Skin is warm and dry.  Neurological:     General: No focal deficit present.     Mental Status: He is alert and oriented to person, place, and time.     Cranial Nerves: No cranial nerve deficit.     Sensory: No sensory deficit.     Motor: No weakness.     Coordination: Coordination normal.    ED  Results / Procedures / Treatments   Labs (all labs ordered are listed, but only abnormal results are displayed) Labs Reviewed  BASIC METABOLIC PANEL - Abnormal; Notable for the following components:      Result Value   Glucose, Bld 111 (*)    All other components within normal limits  CBG MONITORING, ED - Abnormal; Notable for the following components:   Glucose-Capillary 112 (*)    All other components within normal limits  CBC  URINALYSIS, ROUTINE W REFLEX MICROSCOPIC    EKG EKG Interpretation  Date/Time:  Sunday December 15 2021 22:09:41 EST Ventricular Rate:  84 PR Interval:  145 QRS Duration: 89 QT Interval:  356 QTC Calculation: 421 R Axis:   77 Text Interpretation: Sinus rhythm Normal ECG Confirmed by Veryl Speak [phone] on 12/15/2021 11:31:00 PM  Radiology No results found.  Procedures Procedures    Medications Ordered in ED Medications  meclizine (ANTIVERT) tablet 25 mg (has no administration in time range)  sodium chloride 0.9 % bolus 1,000 mL (has no administration in time range)    ED Course/ Medical Decision Making/ A&P  This patient presents to the ED for concern of dizziness, this involves an extensive number of treatment options, and is a complaint that carries with it a high risk of complications and morbidity.  The differential diagnosis includes acute CVA, vertigo, symptomatic anemia   Co morbidities that complicate the patient evaluation  Recent diagnosis of COVID-19 and initiation of therapy with Paxlovid   Additional history obtained:  Additional history obtained from wife at bedside No external records needed   Lab Tests:  I Ordered, and personally interpreted labs.  The pertinent results include: Complete blood count, metabolic panel, urine Alysis, all of which are unremarkable   Imaging Studies ordered:  I ordered imaging studies including CT scan of the head I independently visualized and interpreted imaging which showed  generous ventricles suggestive of possible normal pressure hydrocephalus, however unchanged from 2019 I agree with the radiologist interpretation   Cardiac Monitoring:  The patient was maintained on a cardiac monitor.  I personally viewed and interpreted the cardiac monitored which showed an underlying rhythm of: Sinus rhythm   Medicines ordered and prescription drug management:  I ordered medication including meclizine and IV fluids for dizziness Reevaluation of the patient after these medicines showed that the patient improved I have reviewed the patients home medicines and have made adjustments as needed   Test Considered:  No other test considered or indicated   Critical Interventions:  IV fluids and medication   Consultations Obtained:  No consultation needed  Problem List / ED Course:  Patient presenting with complaints of dizziness in the setting of recent diagnosis of COVID-19 and Paxlovid therapy.  I suspect peripheral vertigo as the etiology, and also possibly additional symptoms related to COVID-19 and/or side effects with Paxlovid.  Patient's work-up is unremarkable and seems to be feeling better after receiving IV fluids and meclizine.  At this point I feel as though patient can safely be discharged with meclizine and outpatient follow-up.   Reevaluation:  After the interventions noted above, I reevaluated the patient and found that they have : Improved   Social Determinants of Health:  None   Dispostion:  After consideration of the diagnostic results and the patients response to treatment, I feel that the patent would benefit from discharge to home with meclizine and as needed return..    Final Clinical Impression(s) / ED Diagnoses Final diagnoses:  None    Rx / DC Orders ED Discharge Orders     None         Veryl Speak, MD 12/16/21 [phone]

## 2021-12-16 MED ORDER — MECLIZINE HCL 25 MG PO TABS: 25.0000 mg | ORAL_TABLET | Freq: Three times a day (TID) | ORAL | 0 refills | Status: AC | PRN

## 2021-12-16 NOTE — ED Notes (Signed)
Discharge instructions including printed prescription and follow up care discussed with pt . Pt verbalized understanding with no questions at this time. Pt to go home with spouse at bedside.

## 2021-12-16 NOTE — Discharge Instructions (Addendum)
Begin taking meclizine as prescribed as needed for dizziness.  Drink plenty of fluids and get plenty of rest.  Follow-up with primary doctor if symptoms are not improving in the next 3 to 4 days, and return to the ER if symptoms significantly worsen or change.

## 2022-01-02 DIAGNOSIS — Z23 Encounter for immunization: Secondary | ICD-10-CM | POA: Diagnosis not present

## 2022-01-03 ENCOUNTER — Other Ambulatory Visit (HOSPITAL_COMMUNITY): Payer: Self-pay

## 2022-01-03 MED ORDER — PEG 3350-KCL-NA BICARB-NACL 420 G PO SOLR
ORAL | 0 refills | Status: AC
  Filled 2022-01-03: qty 4000, 1d supply, fill #0

## 2022-01-07 DIAGNOSIS — D123 Benign neoplasm of transverse colon: Secondary | ICD-10-CM | POA: Diagnosis not present

## 2022-01-07 DIAGNOSIS — D124 Benign neoplasm of descending colon: Secondary | ICD-10-CM | POA: Diagnosis not present

## 2022-01-07 DIAGNOSIS — Z86010 Personal history of colonic polyps: Secondary | ICD-10-CM | POA: Diagnosis not present

## 2022-01-07 DIAGNOSIS — K648 Other hemorrhoids: Secondary | ICD-10-CM | POA: Diagnosis not present

## 2022-01-07 DIAGNOSIS — D120 Benign neoplasm of cecum: Secondary | ICD-10-CM | POA: Diagnosis not present

## 2022-04-25 DIAGNOSIS — C61 Malignant neoplasm of prostate: Secondary | ICD-10-CM | POA: Diagnosis not present

## 2022-05-02 ENCOUNTER — Other Ambulatory Visit (HOSPITAL_COMMUNITY): Payer: Self-pay

## 2022-05-02 DIAGNOSIS — N5201 Erectile dysfunction due to arterial insufficiency: Secondary | ICD-10-CM | POA: Diagnosis not present

## 2022-05-02 DIAGNOSIS — R8271 Bacteriuria: Secondary | ICD-10-CM | POA: Diagnosis not present

## 2022-05-02 DIAGNOSIS — C61 Malignant neoplasm of prostate: Secondary | ICD-10-CM | POA: Diagnosis not present

## 2022-05-02 MED ORDER — SILDENAFIL CITRATE 100 MG PO TABS
ORAL_TABLET | ORAL | 11 refills | Status: AC
  Filled 2022-05-02: qty 5, 5d supply, fill #0

## 2022-06-17 DIAGNOSIS — Z0000 Encounter for general adult medical examination without abnormal findings: Secondary | ICD-10-CM | POA: Diagnosis not present

## 2022-06-17 DIAGNOSIS — R0683 Snoring: Secondary | ICD-10-CM | POA: Diagnosis not present

## 2022-06-17 DIAGNOSIS — Z8546 Personal history of malignant neoplasm of prostate: Secondary | ICD-10-CM | POA: Diagnosis not present

## 2022-06-17 DIAGNOSIS — I10 Essential (primary) hypertension: Secondary | ICD-10-CM | POA: Diagnosis not present

## 2022-06-17 DIAGNOSIS — Z23 Encounter for immunization: Secondary | ICD-10-CM | POA: Diagnosis not present

## 2022-06-17 DIAGNOSIS — M519 Unspecified thoracic, thoracolumbar and lumbosacral intervertebral disc disorder: Secondary | ICD-10-CM | POA: Diagnosis not present

## 2022-06-17 DIAGNOSIS — E7800 Pure hypercholesterolemia, unspecified: Secondary | ICD-10-CM | POA: Diagnosis not present

## 2022-06-17 DIAGNOSIS — Z1389 Encounter for screening for other disorder: Secondary | ICD-10-CM | POA: Diagnosis not present

## 2022-06-17 DIAGNOSIS — N182 Chronic kidney disease, stage 2 (mild): Secondary | ICD-10-CM | POA: Diagnosis not present

## 2022-10-29 DIAGNOSIS — C61 Malignant neoplasm of prostate: Secondary | ICD-10-CM | POA: Diagnosis not present

## 2022-10-29 DIAGNOSIS — R278 Other lack of coordination: Secondary | ICD-10-CM | POA: Diagnosis not present

## 2022-10-29 DIAGNOSIS — N393 Stress incontinence (female) (male): Secondary | ICD-10-CM | POA: Diagnosis not present

## 2022-11-07 DIAGNOSIS — C61 Malignant neoplasm of prostate: Secondary | ICD-10-CM | POA: Diagnosis not present

## 2022-12-16 ENCOUNTER — Other Ambulatory Visit (HOSPITAL_COMMUNITY): Payer: Self-pay

## 2022-12-16 DIAGNOSIS — I10 Essential (primary) hypertension: Secondary | ICD-10-CM | POA: Diagnosis not present

## 2022-12-16 DIAGNOSIS — Z8546 Personal history of malignant neoplasm of prostate: Secondary | ICD-10-CM | POA: Diagnosis not present

## 2022-12-16 DIAGNOSIS — N1831 Chronic kidney disease, stage 3a: Secondary | ICD-10-CM | POA: Diagnosis not present

## 2022-12-16 DIAGNOSIS — E7800 Pure hypercholesterolemia, unspecified: Secondary | ICD-10-CM | POA: Diagnosis not present

## 2022-12-16 MED ORDER — LOSARTAN POTASSIUM 25 MG PO TABS
25.0000 mg | ORAL_TABLET | Freq: Every day | ORAL | 5 refills | Status: DC
  Filled 2022-12-16: qty 90, 90d supply, fill #0

## 2022-12-31 DIAGNOSIS — E7800 Pure hypercholesterolemia, unspecified: Secondary | ICD-10-CM | POA: Diagnosis not present

## 2022-12-31 DIAGNOSIS — I10 Essential (primary) hypertension: Secondary | ICD-10-CM | POA: Diagnosis not present

## 2023-01-01 ENCOUNTER — Other Ambulatory Visit (HOSPITAL_COMMUNITY): Payer: Self-pay

## 2023-01-01 MED ORDER — ATORVASTATIN CALCIUM 10 MG PO TABS
10.0000 mg | ORAL_TABLET | Freq: Every day | ORAL | 3 refills | Status: AC
  Filled 2023-01-01: qty 90, 90d supply, fill #0

## 2023-01-27 DIAGNOSIS — M5416 Radiculopathy, lumbar region: Secondary | ICD-10-CM | POA: Diagnosis not present

## 2023-01-27 DIAGNOSIS — M542 Cervicalgia: Secondary | ICD-10-CM | POA: Diagnosis not present

## 2023-01-29 DIAGNOSIS — R278 Other lack of coordination: Secondary | ICD-10-CM | POA: Diagnosis not present

## 2023-01-29 DIAGNOSIS — N393 Stress incontinence (female) (male): Secondary | ICD-10-CM | POA: Diagnosis not present

## 2023-02-04 DIAGNOSIS — M5416 Radiculopathy, lumbar region: Secondary | ICD-10-CM | POA: Diagnosis not present

## 2023-03-03 DIAGNOSIS — Z0189 Encounter for other specified special examinations: Secondary | ICD-10-CM | POA: Diagnosis not present

## 2023-03-03 DIAGNOSIS — M5416 Radiculopathy, lumbar region: Secondary | ICD-10-CM | POA: Diagnosis not present

## 2023-03-05 DIAGNOSIS — E7800 Pure hypercholesterolemia, unspecified: Secondary | ICD-10-CM | POA: Diagnosis not present

## 2023-03-05 DIAGNOSIS — M5432 Sciatica, left side: Secondary | ICD-10-CM | POA: Diagnosis not present

## 2023-03-05 DIAGNOSIS — Z8546 Personal history of malignant neoplasm of prostate: Secondary | ICD-10-CM | POA: Diagnosis not present

## 2023-03-05 DIAGNOSIS — N182 Chronic kidney disease, stage 2 (mild): Secondary | ICD-10-CM | POA: Diagnosis not present

## 2023-03-05 DIAGNOSIS — L600 Ingrowing nail: Secondary | ICD-10-CM | POA: Diagnosis not present

## 2023-03-05 DIAGNOSIS — M519 Unspecified thoracic, thoracolumbar and lumbosacral intervertebral disc disorder: Secondary | ICD-10-CM | POA: Diagnosis not present

## 2023-03-05 DIAGNOSIS — I10 Essential (primary) hypertension: Secondary | ICD-10-CM | POA: Diagnosis not present

## 2023-03-12 DIAGNOSIS — M545 Low back pain, unspecified: Secondary | ICD-10-CM | POA: Diagnosis not present

## 2023-03-25 DIAGNOSIS — N393 Stress incontinence (female) (male): Secondary | ICD-10-CM | POA: Diagnosis not present

## 2023-03-25 DIAGNOSIS — R278 Other lack of coordination: Secondary | ICD-10-CM | POA: Diagnosis not present

## 2023-03-27 DIAGNOSIS — M5416 Radiculopathy, lumbar region: Secondary | ICD-10-CM | POA: Diagnosis not present

## 2023-05-01 DIAGNOSIS — C61 Malignant neoplasm of prostate: Secondary | ICD-10-CM | POA: Diagnosis not present

## 2023-05-08 DIAGNOSIS — N5201 Erectile dysfunction due to arterial insufficiency: Secondary | ICD-10-CM | POA: Diagnosis not present

## 2023-05-08 DIAGNOSIS — C61 Malignant neoplasm of prostate: Secondary | ICD-10-CM | POA: Diagnosis not present

## 2023-05-08 DIAGNOSIS — N393 Stress incontinence (female) (male): Secondary | ICD-10-CM | POA: Diagnosis not present

## 2023-06-01 DIAGNOSIS — I10 Essential (primary) hypertension: Secondary | ICD-10-CM | POA: Diagnosis not present

## 2023-06-01 DIAGNOSIS — N182 Chronic kidney disease, stage 2 (mild): Secondary | ICD-10-CM | POA: Diagnosis not present

## 2023-06-01 DIAGNOSIS — Z8546 Personal history of malignant neoplasm of prostate: Secondary | ICD-10-CM | POA: Diagnosis not present

## 2023-06-01 DIAGNOSIS — E7800 Pure hypercholesterolemia, unspecified: Secondary | ICD-10-CM | POA: Diagnosis not present

## 2023-06-01 DIAGNOSIS — Z0000 Encounter for general adult medical examination without abnormal findings: Secondary | ICD-10-CM | POA: Diagnosis not present

## 2023-06-01 DIAGNOSIS — Z1389 Encounter for screening for other disorder: Secondary | ICD-10-CM | POA: Diagnosis not present

## 2023-06-02 DIAGNOSIS — M5416 Radiculopathy, lumbar region: Secondary | ICD-10-CM | POA: Diagnosis not present

## 2023-07-13 DIAGNOSIS — M5416 Radiculopathy, lumbar region: Secondary | ICD-10-CM | POA: Diagnosis not present

## 2023-07-21 ENCOUNTER — Ambulatory Visit: Payer: PPO | Admitting: Neurology

## 2023-08-11 DIAGNOSIS — Z6829 Body mass index (BMI) 29.0-29.9, adult: Secondary | ICD-10-CM | POA: Diagnosis not present

## 2023-08-11 DIAGNOSIS — M5416 Radiculopathy, lumbar region: Secondary | ICD-10-CM | POA: Diagnosis not present

## 2023-08-24 ENCOUNTER — Other Ambulatory Visit (HOSPITAL_COMMUNITY): Payer: Self-pay

## 2023-09-30 DIAGNOSIS — M4805 Spinal stenosis, thoracolumbar region: Secondary | ICD-10-CM | POA: Diagnosis not present

## 2023-10-06 DIAGNOSIS — M4805 Spinal stenosis, thoracolumbar region: Secondary | ICD-10-CM | POA: Diagnosis not present

## 2023-10-08 DIAGNOSIS — M4805 Spinal stenosis, thoracolumbar region: Secondary | ICD-10-CM | POA: Diagnosis not present

## 2023-10-28 ENCOUNTER — Ambulatory Visit: Payer: PPO | Admitting: Diagnostic Neuroimaging

## 2024-03-24 DIAGNOSIS — N182 Chronic kidney disease, stage 2 (mild): Secondary | ICD-10-CM | POA: Diagnosis not present

## 2024-03-24 DIAGNOSIS — I10 Essential (primary) hypertension: Secondary | ICD-10-CM | POA: Diagnosis not present

## 2024-03-24 DIAGNOSIS — N1831 Chronic kidney disease, stage 3a: Secondary | ICD-10-CM | POA: Diagnosis not present

## 2024-04-18 DIAGNOSIS — I10 Essential (primary) hypertension: Secondary | ICD-10-CM | POA: Diagnosis not present

## 2024-04-18 DIAGNOSIS — N1831 Chronic kidney disease, stage 3a: Secondary | ICD-10-CM | POA: Diagnosis not present

## 2024-04-18 DIAGNOSIS — E7800 Pure hypercholesterolemia, unspecified: Secondary | ICD-10-CM | POA: Diagnosis not present

## 2024-04-18 DIAGNOSIS — Z8546 Personal history of malignant neoplasm of prostate: Secondary | ICD-10-CM | POA: Diagnosis not present

## 2024-04-18 DIAGNOSIS — N182 Chronic kidney disease, stage 2 (mild): Secondary | ICD-10-CM | POA: Diagnosis not present

## 2024-04-22 DIAGNOSIS — I10 Essential (primary) hypertension: Secondary | ICD-10-CM | POA: Diagnosis not present

## 2024-04-22 DIAGNOSIS — N182 Chronic kidney disease, stage 2 (mild): Secondary | ICD-10-CM | POA: Diagnosis not present

## 2024-04-22 DIAGNOSIS — N1831 Chronic kidney disease, stage 3a: Secondary | ICD-10-CM | POA: Diagnosis not present

## 2024-05-13 DIAGNOSIS — N5201 Erectile dysfunction due to arterial insufficiency: Secondary | ICD-10-CM | POA: Diagnosis not present

## 2024-05-13 DIAGNOSIS — C61 Malignant neoplasm of prostate: Secondary | ICD-10-CM | POA: Diagnosis not present

## 2024-05-19 DIAGNOSIS — N182 Chronic kidney disease, stage 2 (mild): Secondary | ICD-10-CM | POA: Diagnosis not present

## 2024-05-19 DIAGNOSIS — Z8546 Personal history of malignant neoplasm of prostate: Secondary | ICD-10-CM | POA: Diagnosis not present

## 2024-05-19 DIAGNOSIS — N1831 Chronic kidney disease, stage 3a: Secondary | ICD-10-CM | POA: Diagnosis not present

## 2024-05-19 DIAGNOSIS — I10 Essential (primary) hypertension: Secondary | ICD-10-CM | POA: Diagnosis not present

## 2024-05-19 DIAGNOSIS — E7800 Pure hypercholesterolemia, unspecified: Secondary | ICD-10-CM | POA: Diagnosis not present

## 2024-05-22 DIAGNOSIS — N182 Chronic kidney disease, stage 2 (mild): Secondary | ICD-10-CM | POA: Diagnosis not present

## 2024-05-22 DIAGNOSIS — N1831 Chronic kidney disease, stage 3a: Secondary | ICD-10-CM | POA: Diagnosis not present

## 2024-05-22 DIAGNOSIS — I10 Essential (primary) hypertension: Secondary | ICD-10-CM | POA: Diagnosis not present

## 2024-06-06 DIAGNOSIS — G250 Essential tremor: Secondary | ICD-10-CM | POA: Diagnosis not present

## 2024-06-06 DIAGNOSIS — N529 Male erectile dysfunction, unspecified: Secondary | ICD-10-CM | POA: Diagnosis not present

## 2024-06-06 DIAGNOSIS — M519 Unspecified thoracic, thoracolumbar and lumbosacral intervertebral disc disorder: Secondary | ICD-10-CM | POA: Diagnosis not present

## 2024-06-06 DIAGNOSIS — Z0000 Encounter for general adult medical examination without abnormal findings: Secondary | ICD-10-CM | POA: Diagnosis not present

## 2024-06-06 DIAGNOSIS — E7800 Pure hypercholesterolemia, unspecified: Secondary | ICD-10-CM | POA: Diagnosis not present

## 2024-06-06 DIAGNOSIS — I10 Essential (primary) hypertension: Secondary | ICD-10-CM | POA: Diagnosis not present

## 2024-06-06 DIAGNOSIS — Z1331 Encounter for screening for depression: Secondary | ICD-10-CM | POA: Diagnosis not present

## 2024-06-06 DIAGNOSIS — J309 Allergic rhinitis, unspecified: Secondary | ICD-10-CM | POA: Diagnosis not present

## 2024-06-06 DIAGNOSIS — H9319 Tinnitus, unspecified ear: Secondary | ICD-10-CM | POA: Diagnosis not present

## 2024-06-06 DIAGNOSIS — Z8546 Personal history of malignant neoplasm of prostate: Secondary | ICD-10-CM | POA: Diagnosis not present

## 2024-06-06 DIAGNOSIS — Z23 Encounter for immunization: Secondary | ICD-10-CM | POA: Diagnosis not present

## 2024-06-06 DIAGNOSIS — N182 Chronic kidney disease, stage 2 (mild): Secondary | ICD-10-CM | POA: Diagnosis not present

## 2024-06-19 DIAGNOSIS — E7800 Pure hypercholesterolemia, unspecified: Secondary | ICD-10-CM | POA: Diagnosis not present

## 2024-06-19 DIAGNOSIS — N182 Chronic kidney disease, stage 2 (mild): Secondary | ICD-10-CM | POA: Diagnosis not present

## 2024-06-19 DIAGNOSIS — N1831 Chronic kidney disease, stage 3a: Secondary | ICD-10-CM | POA: Diagnosis not present

## 2024-06-19 DIAGNOSIS — Z8546 Personal history of malignant neoplasm of prostate: Secondary | ICD-10-CM | POA: Diagnosis not present

## 2024-06-19 DIAGNOSIS — I10 Essential (primary) hypertension: Secondary | ICD-10-CM | POA: Diagnosis not present

## 2024-06-21 DIAGNOSIS — N182 Chronic kidney disease, stage 2 (mild): Secondary | ICD-10-CM | POA: Diagnosis not present

## 2024-06-21 DIAGNOSIS — N1831 Chronic kidney disease, stage 3a: Secondary | ICD-10-CM | POA: Diagnosis not present

## 2024-06-21 DIAGNOSIS — I10 Essential (primary) hypertension: Secondary | ICD-10-CM | POA: Diagnosis not present

## 2024-07-19 DIAGNOSIS — N182 Chronic kidney disease, stage 2 (mild): Secondary | ICD-10-CM | POA: Diagnosis not present

## 2024-07-19 DIAGNOSIS — I10 Essential (primary) hypertension: Secondary | ICD-10-CM | POA: Diagnosis not present

## 2024-07-19 DIAGNOSIS — Z8546 Personal history of malignant neoplasm of prostate: Secondary | ICD-10-CM | POA: Diagnosis not present

## 2024-07-19 DIAGNOSIS — N1831 Chronic kidney disease, stage 3a: Secondary | ICD-10-CM | POA: Diagnosis not present

## 2024-07-19 DIAGNOSIS — E7800 Pure hypercholesterolemia, unspecified: Secondary | ICD-10-CM | POA: Diagnosis not present

## 2024-07-21 DIAGNOSIS — N182 Chronic kidney disease, stage 2 (mild): Secondary | ICD-10-CM | POA: Diagnosis not present

## 2024-07-21 DIAGNOSIS — I10 Essential (primary) hypertension: Secondary | ICD-10-CM | POA: Diagnosis not present

## 2024-07-21 DIAGNOSIS — N1831 Chronic kidney disease, stage 3a: Secondary | ICD-10-CM | POA: Diagnosis not present

## 2024-08-19 DIAGNOSIS — I10 Essential (primary) hypertension: Secondary | ICD-10-CM | POA: Diagnosis not present

## 2024-08-19 DIAGNOSIS — N182 Chronic kidney disease, stage 2 (mild): Secondary | ICD-10-CM | POA: Diagnosis not present

## 2024-08-19 DIAGNOSIS — E7800 Pure hypercholesterolemia, unspecified: Secondary | ICD-10-CM | POA: Diagnosis not present

## 2024-08-19 DIAGNOSIS — N1831 Chronic kidney disease, stage 3a: Secondary | ICD-10-CM | POA: Diagnosis not present

## 2024-08-19 DIAGNOSIS — Z8546 Personal history of malignant neoplasm of prostate: Secondary | ICD-10-CM | POA: Diagnosis not present

## 2024-08-20 DIAGNOSIS — N182 Chronic kidney disease, stage 2 (mild): Secondary | ICD-10-CM | POA: Diagnosis not present

## 2024-08-20 DIAGNOSIS — I10 Essential (primary) hypertension: Secondary | ICD-10-CM | POA: Diagnosis not present

## 2024-08-20 DIAGNOSIS — N1831 Chronic kidney disease, stage 3a: Secondary | ICD-10-CM | POA: Diagnosis not present

## 2024-08-23 DIAGNOSIS — H903 Sensorineural hearing loss, bilateral: Secondary | ICD-10-CM | POA: Diagnosis not present

## 2024-09-18 DIAGNOSIS — I10 Essential (primary) hypertension: Secondary | ICD-10-CM | POA: Diagnosis not present

## 2024-09-18 DIAGNOSIS — E7800 Pure hypercholesterolemia, unspecified: Secondary | ICD-10-CM | POA: Diagnosis not present

## 2024-09-18 DIAGNOSIS — Z8546 Personal history of malignant neoplasm of prostate: Secondary | ICD-10-CM | POA: Diagnosis not present

## 2024-09-18 DIAGNOSIS — N1831 Chronic kidney disease, stage 3a: Secondary | ICD-10-CM | POA: Diagnosis not present

## 2024-09-18 DIAGNOSIS — N182 Chronic kidney disease, stage 2 (mild): Secondary | ICD-10-CM | POA: Diagnosis not present

## 2024-09-21 DIAGNOSIS — H9313 Tinnitus, bilateral: Secondary | ICD-10-CM | POA: Diagnosis not present

## 2024-09-21 DIAGNOSIS — H903 Sensorineural hearing loss, bilateral: Secondary | ICD-10-CM | POA: Diagnosis not present
